# Patient Record
Sex: Male | Born: 1950 | Race: White | Hispanic: No | Marital: Married | Smoking: Former smoker
Health system: Southern US, Community
[De-identification: ages and names within clinical notes are randomized; demographics above are authoritative.]

## PROBLEM LIST (undated history)

## (undated) DIAGNOSIS — I10 Essential (primary) hypertension: Secondary | ICD-10-CM

## (undated) DIAGNOSIS — K219 Gastro-esophageal reflux disease without esophagitis: Secondary | ICD-10-CM

## (undated) DIAGNOSIS — E785 Hyperlipidemia, unspecified: Secondary | ICD-10-CM

## (undated) DIAGNOSIS — R7303 Prediabetes: Secondary | ICD-10-CM

## (undated) HISTORY — PX: FOOT SURGERY: SHX648

## (undated) HISTORY — PX: HARDWARE REMOVAL: SHX979

---

## 1985-06-19 HISTORY — PX: FOOT SURGERY: SHX648

## 2012-11-15 ENCOUNTER — Emergency Department (HOSPITAL_COMMUNITY)
Admission: EM | Admit: 2012-11-15 | Discharge: 2012-11-15 | Disposition: A | Discharge status: Home / Self Care | Payer: Self-pay | Source: Home / Self Care | Attending: Emergency Medicine | Admitting: Emergency Medicine

## 2012-11-15 ENCOUNTER — Encounter (HOSPITAL_COMMUNITY): Payer: Self-pay | Admitting: Emergency Medicine

## 2012-11-15 ENCOUNTER — Emergency Department (INDEPENDENT_AMBULATORY_CARE_PROVIDER_SITE_OTHER): Payer: Self-pay

## 2012-11-15 DIAGNOSIS — R509 Fever, unspecified: Secondary | ICD-10-CM

## 2012-11-15 LAB — POCT URINALYSIS DIP (DEVICE)
Bilirubin Urine: NEGATIVE
Glucose, UA: NEGATIVE mg/dL
Ketones, ur: NEGATIVE mg/dL
Specific Gravity, Urine: >=1.03 (ref 1.005–1.030)
Urobilinogen, UA: 1 mg/dL (ref 0.0–1.0)

## 2012-11-15 LAB — CBC WITH DIFFERENTIAL/PLATELET
Eosinophils Absolute: 0 10*3/uL (ref 0.0–0.7)
HCT: 44.5 % (ref 39.0–52.0)
Hemoglobin: 15.9 g/dL (ref 13.0–17.0)
Lymphs Abs: 0.8 10*3/uL (ref 0.7–4.0)
MCH: 30.7 pg (ref 26.0–34.0)
Monocytes Absolute: 0.7 10*3/uL (ref 0.1–1.0)
Monocytes Relative: 6 % (ref 3–12)
Neutro Abs: 9.6 10*3/uL — ABNORMAL HIGH (ref 1.7–7.7)
Neutrophils Relative %: 86 % — ABNORMAL HIGH (ref 43–77)
RBC: 5.18 MIL/uL (ref 4.22–5.81)

## 2012-11-15 MED ORDER — DOXYCYCLINE HYCLATE 100 MG PO TABS: 100.0000 mg | ORAL_TABLET | Freq: Two times a day (BID) | ORAL | Status: DC

## 2012-11-15 MED ORDER — CIPROFLOXACIN HCL 500 MG PO TABS: 500.0000 mg | ORAL_TABLET | Freq: Two times a day (BID) | ORAL | Status: DC

## 2012-11-15 NOTE — ED Provider Notes (Signed)
Chief Complaint:   Chief Complaint  Patient presents with  . Fever    fever x 2 days. asa for symptoms.     History of Present Illness:   Gillermo Poch is a 62 year old male with had a two-day history of fever of up to 13.8, malaise, and fatigue. He denies any other symptoms. Specifically, he denies headache, muscle aches, nasal congestion, rhinorrhea, sore throat, adenopathy, or stiff neck. He's had no coughing, wheezing, shortness of breath, or chest pain. He denies any abdominal pain, nausea, vomiting, or diarrhea. His appetite has been fair. He's eating a little bit less than usual but today had 2 cheeseburgers. He denies any urinary symptoms such as dysuria, frequency, urgency, or hematuria. He's had no aching or swelling in his arms or legs and denies any skin rash. He does have a history of a tick bite on his right arm about a week ago. He's had no recent foreign travel, no suspicious exposures or ingestions, he is exposed to multiple farm animals including pigs and chickens.  Review of Systems:  Other than noted above, the patient denies any of the following symptoms. Systemic:  No chills, sweats, fatigue, myalgias, headache, or anorexia. Eye:  No redness, pain or drainage. ENT:  No earache, nasal congestion, rhinorrhea, sinus pressure, or sore throat. No adenopathy or stiff neck. Lungs:  No cough, sputum production, wheezing, shortness of breath.  Cardiovascular:  No chest pain, palpitations, or syncope. GI:  No nausea, vomiting, abdominal pain or diarrhea. GU:  No dysuria, frequency, or hematuria. Skin:  No rash or pruritis.  PMFSH:  Past medical history, family history, social history, meds, and allergies were reviewed. There is no history of recent foreign travel, animal exposure, suspicious ingestions or tick bite.  No new medications, vaccination, or bites or stings.    Physical Exam:   Vital signs:  BP 147/82  Pulse 100  Temp(Src) 98.2 F (36.8 C) (Oral)  SpO2 97% General:   Alert, in no distress. Eye:  PERRL, full EOMs.  Lids and conjunctivas were normal. ENT:  TMs and canals were normal, without erythema or inflammation.  Nasal mucosa was clear and uncongested, without drainage.  Mucous membranes were moist.  Pharynx was clear, without exudate or drainage.  There were no oral ulcerations or lesions. Neck:  Supple, no adenopathy, tenderness or mass. Thyroid was normal. Lungs:  No respiratory distress.  Lungs were clear to auscultation, without wheezes, rales or rhonchi.  Breath sounds were clear and equal bilaterally. Heart:  Regular rhythm, without gallops, murmers or rubs. Abdomen:  Soft, flat, and non-tender to palpation.  No hepatosplenomagaly or mass. Extremities:  No swelling, erythema, or joint pain to palpation. Skin:  Clear, warm, and dry, without rash or lesions. He has a tiny red spot on his triceps area on his right arm where he had been bitten by a tick about a week ago. This measures no more than 1-2 mm.  Labs:   Results for orders placed during the hospital encounter of 11/15/12  CBC WITH DIFFERENTIAL      Result Value Range   WBC 11.2 (*) 4.0 - 10.5 K/uL   RBC 5.18  4.22 - 5.81 MIL/uL   Hemoglobin 15.9  13.0 - 17.0 g/dL   HCT 16.1  09.6 - 04.5 %   MCV 85.9  78.0 - 100.0 fL   MCH 30.7  26.0 - 34.0 pg   MCHC 35.7  30.0 - 36.0 g/dL   RDW 40.9  81.1 - 91.4 %  Platelets 135 (*) 150 - 400 K/uL   Neutrophils Relative % 86 (*) 43 - 77 %   Neutro Abs 9.6 (*) 1.7 - 7.7 K/uL   Lymphocytes Relative 8 (*) 12 - 46 %   Lymphs Abs 0.8  0.7 - 4.0 K/uL   Monocytes Relative 6  3 - 12 %   Monocytes Absolute 0.7  0.1 - 1.0 K/uL   Eosinophils Relative 0  0 - 5 %   Eosinophils Absolute 0.0  0.0 - 0.7 K/uL   Basophils Relative 0  0 - 1 %   Basophils Absolute 0.0  0.0 - 0.1 K/uL  POCT URINALYSIS DIP (DEVICE)      Result Value Range   Glucose, UA NEGATIVE  NEGATIVE mg/dL   Bilirubin Urine NEGATIVE  NEGATIVE   Ketones, ur NEGATIVE  NEGATIVE mg/dL   Specific  Gravity, Urine >=1.030  1.005 - 1.030   Hgb urine dipstick MODERATE (*) NEGATIVE   pH 5.5  5.0 - 8.0   Protein, ur 100 (*) NEGATIVE mg/dL   Urobilinogen, UA 1.0  0.0 - 1.0 mg/dL   Nitrite NEGATIVE  NEGATIVE   Leukocytes, UA NEGATIVE  NEGATIVE    Urine culture was obtained as well as serologies for [facility] spotted fever.  Radiology:  Dg Chest 2 View  11/15/2012   *RADIOLOGY REPORT*  Clinical Data: Fever.  CHEST - 2 VIEW  Comparison: None.  Findings: Mild bibasilar scarring noted.  No evidence of pulmonary infiltrate or edema.  Evidence of pleural effusion.  Heart size and mediastinal contours are normal.  IMPRESSION: Mild bibasilar scarring.  No active disease.   Original Report Authenticated By: Myles Rosenthal, M.D.   Assessment:  The encounter diagnosis was FUO (fever of unknown origin).  Right now diagnostic considerations include urinary tract infection or [facility] spotted fever. We'll treat with both Cipro and doxycycline. I suggested he come back again in 48-72 hours for recheck. We'll his lab work should be back by then. We'll need to recheck the urine at that time. I did tell him that he will need to followup with her primary care physician and get at least 2 clear urine this, before we can say that there is no concern about the hematuria. He does have any persistent hematuria, he will need to see a urologist.  Plan:   1.  The following meds were prescribed:   New Prescriptions   CIPROFLOXACIN (CIPRO) 500 MG TABLET    Take 1 tablet (500 mg total) by mouth every 12 (twelve) hours.   DOXYCYCLINE (VIBRA-TABS) 100 MG TABLET    Take 1 tablet (100 mg total) by mouth 2 (two) times daily.   2.  The patient was instructed in symptomatic care and handouts were given. 3.  The patient was told to return if becoming worse in any way, for a scheduled recheck in 48-72 hours, and given some red flag symptoms such as difficulty breathing, pain, or persistent vomiting, or shortness of breath that  would indicate earlier return. 4.  Follow up here in 48-72 hours.    Reuben Likes, MD 11/15/12 2045

## 2012-11-15 NOTE — ED Notes (Signed)
Pt c/o fever and fatigue x 2 days. Denies any other symptoms.   Pt's brother states that over the past several weeks he has had tick bites.

## 2012-11-17 ENCOUNTER — Encounter (HOSPITAL_COMMUNITY): Payer: Self-pay | Admitting: Emergency Medicine

## 2012-11-17 ENCOUNTER — Emergency Department (HOSPITAL_COMMUNITY)
Admission: EM | Admit: 2012-11-17 | Discharge: 2012-11-17 | Disposition: A | Discharge status: Home / Self Care | Payer: Self-pay | Source: Home / Self Care | Attending: Family Medicine | Admitting: Family Medicine

## 2012-11-17 DIAGNOSIS — R509 Fever, unspecified: Secondary | ICD-10-CM

## 2012-11-17 HISTORY — DX: Essential (primary) hypertension: I10

## 2012-11-17 LAB — URINE CULTURE
Colony Count: NO GROWTH
Culture: NO GROWTH

## 2012-11-17 NOTE — ED Notes (Signed)
Pt here for follow up to visit on 5/30. Was seen for a high fever over several days. Pt is still taking prescribed meds.

## 2012-11-17 NOTE — ED Provider Notes (Signed)
History     CSN: [id number]  Arrival date & time 11/17/12  1543   None     Chief Complaint  Patient presents with  . Follow-up    follow up to visit on 5/30.     (Consider location/radiation/quality/duration/timing/severity/associated sxs/prior treatment) Patient is a 62 y.o. male presenting with general illness. The history is provided by the patient.  Illness Progression:  Improving Chronicity:  New Context:  Seen 5/30 , taking abx, overall improved, unable to provide u/a today, Associated symptoms: no abdominal pain, no chest pain, no cough and no fever     Past Medical History  Diagnosis Date  . Hypertension     Past Surgical History  Procedure Laterality Date  . Foot surgery      History reviewed. No pertinent family history.  History  Substance Use Topics  . Smoking status: Never Smoker   . Smokeless tobacco: Not on file  . Alcohol Use: Yes      Review of Systems  Constitutional: Negative.  Negative for fever.  Respiratory: Negative for cough.   Cardiovascular: Negative for chest pain.  Gastrointestinal: Negative.  Negative for abdominal pain.  Genitourinary: Negative.     Allergies  Review of patient's allergies indicates no known allergies.  Home Medications   Current Outpatient Rx  Name  Route  Sig  Dispense  Refill  . ciprofloxacin (CIPRO) 500 MG tablet   Oral   Take 1 tablet (500 mg total) by mouth every 12 (twelve) hours.   20 tablet   0   . doxycycline (VIBRA-TABS) 100 MG tablet   Oral   Take 1 tablet (100 mg total) by mouth 2 (two) times daily.   20 tablet   0     There were no vitals taken for this visit.  Physical Exam  Nursing note and vitals reviewed. Constitutional: He is oriented to person, place, and time. He appears well-developed and well-nourished. No distress.  HENT:  Mouth/Throat: Oropharynx is clear and moist.  Eyes: Conjunctivae are normal. Pupils are equal, round, and reactive to light.  Neck: Normal range  of motion. Neck supple.  Cardiovascular: Normal heart sounds.   Pulmonary/Chest: Breath sounds normal.  Abdominal: Soft. Bowel sounds are normal. He exhibits no distension and no mass. There is no tenderness. There is no rebound and no guarding.  Neurological: He is alert and oriented to person, place, and time.  Skin: Skin is warm and dry.    ED Course  Procedures (including critical care time)  Labs Reviewed - No data to display Dg Chest 2 View  11/15/2012   *RADIOLOGY REPORT*  Clinical Data: Fever.  CHEST - 2 VIEW  Comparison: None.  Findings: Mild bibasilar scarring noted.  No evidence of pulmonary infiltrate or edema.  Evidence of pleural effusion.  Heart size and mediastinal contours are normal.  IMPRESSION: Mild bibasilar scarring.  No active disease.   Original Report Authenticated By: Myles Rosenthal, M.D.     1. Fever of undetermined origin       MDM  Urine cx no growth, rmsf screen incomplete. Pt unable to void, will d/c and f/u in 4 days for recheck.        Linna Hoff, MD 11/17/12 [phone]

## 2012-11-17 NOTE — ED Notes (Signed)
Pt states not feeling to bad. Mw,cma

## 2012-11-18 NOTE — ED Notes (Signed)
Chart review; notification from lab for elevated RMSF , reading 1.46; Dr Lorenz Coaster advised; pt already on antibiotics

## 2012-11-19 NOTE — ED Notes (Signed)
Attempted to call to notify of lab results, voice mail not yet set up by patient , and unable to leave message

## 2012-11-19 NOTE — ED Notes (Signed)
Spoke w patient , states he is feeling better, taking Rx as directed , and plans to return for a recheck in AM

## 2012-11-19 NOTE — ED Notes (Signed)
Attempted to leave voice mail message, but recording indicates the [facility] has not yet been set up ; will attempt later

## 2015-01-06 IMAGING — CR DG CHEST 2V
2 series · 2 of 2 positions shown · non-contrast
Comparison: None.

CLINICAL DATA: Fever.

CHEST - 2 VIEW

[view not recorded (1 of 2)]
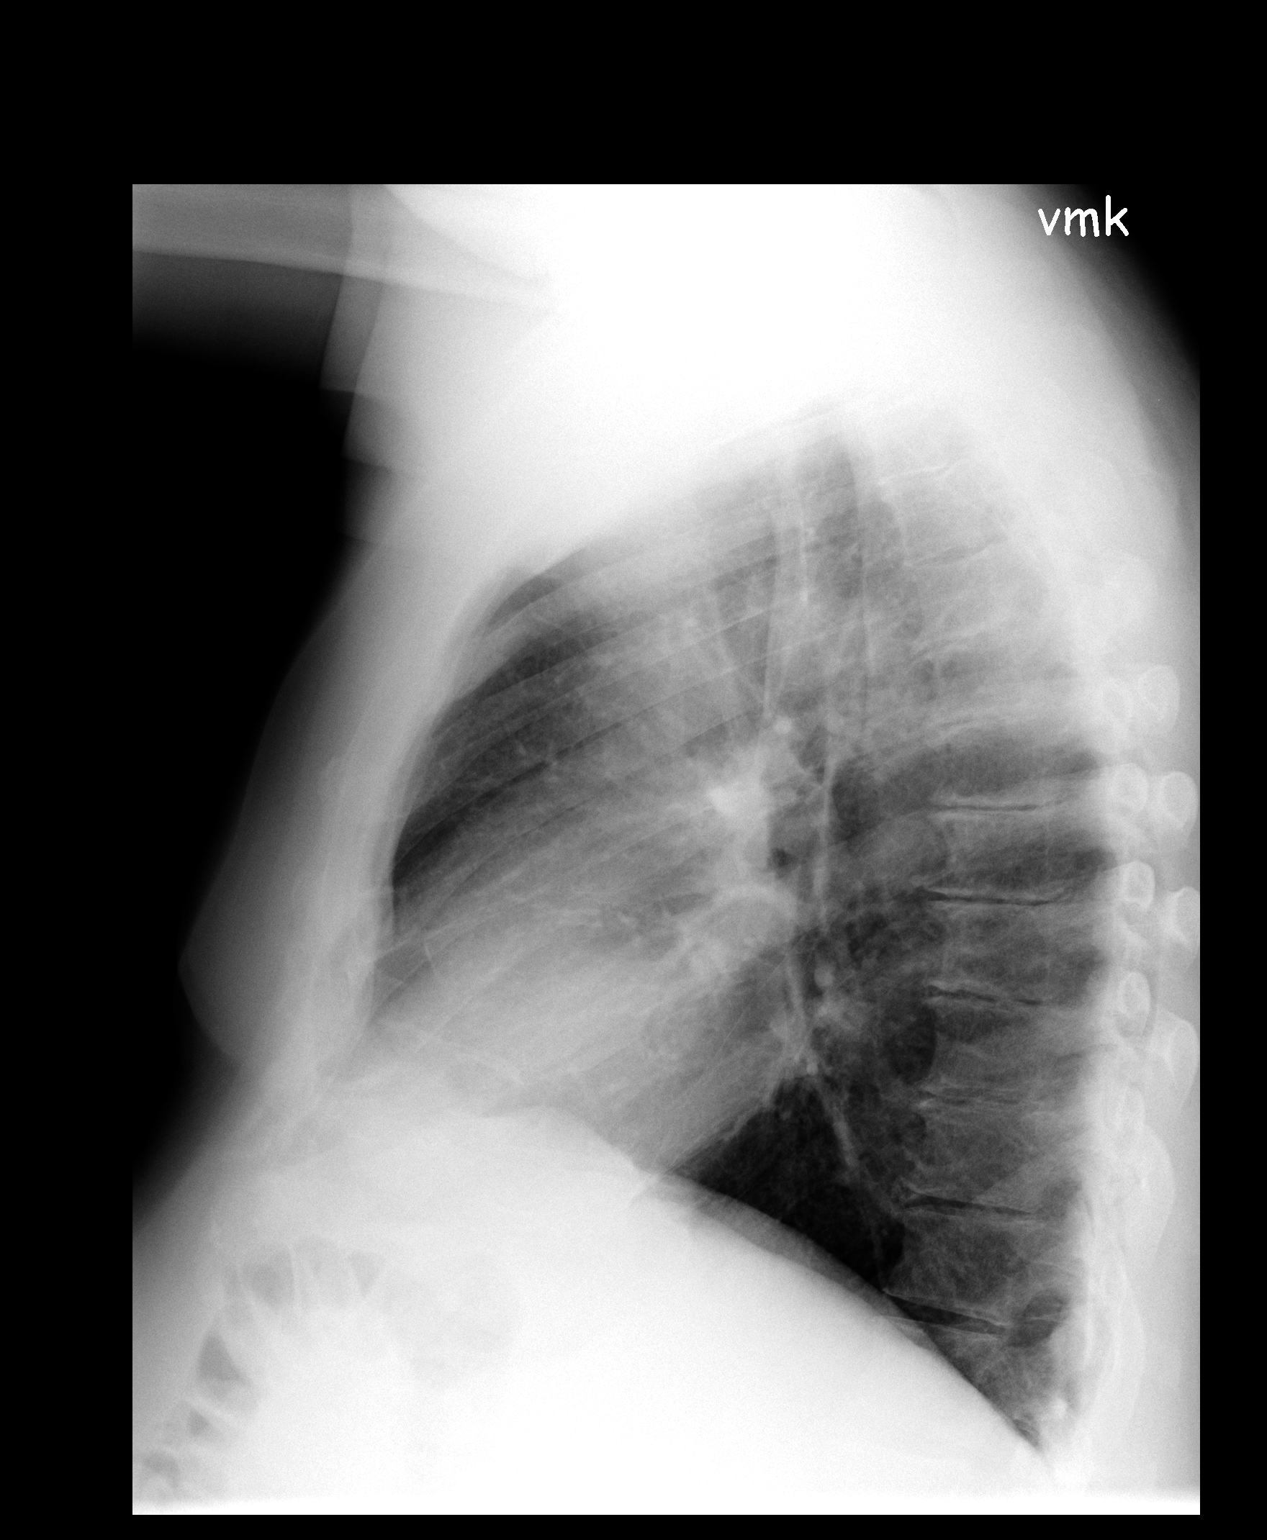

[view not recorded (2 of 2)]
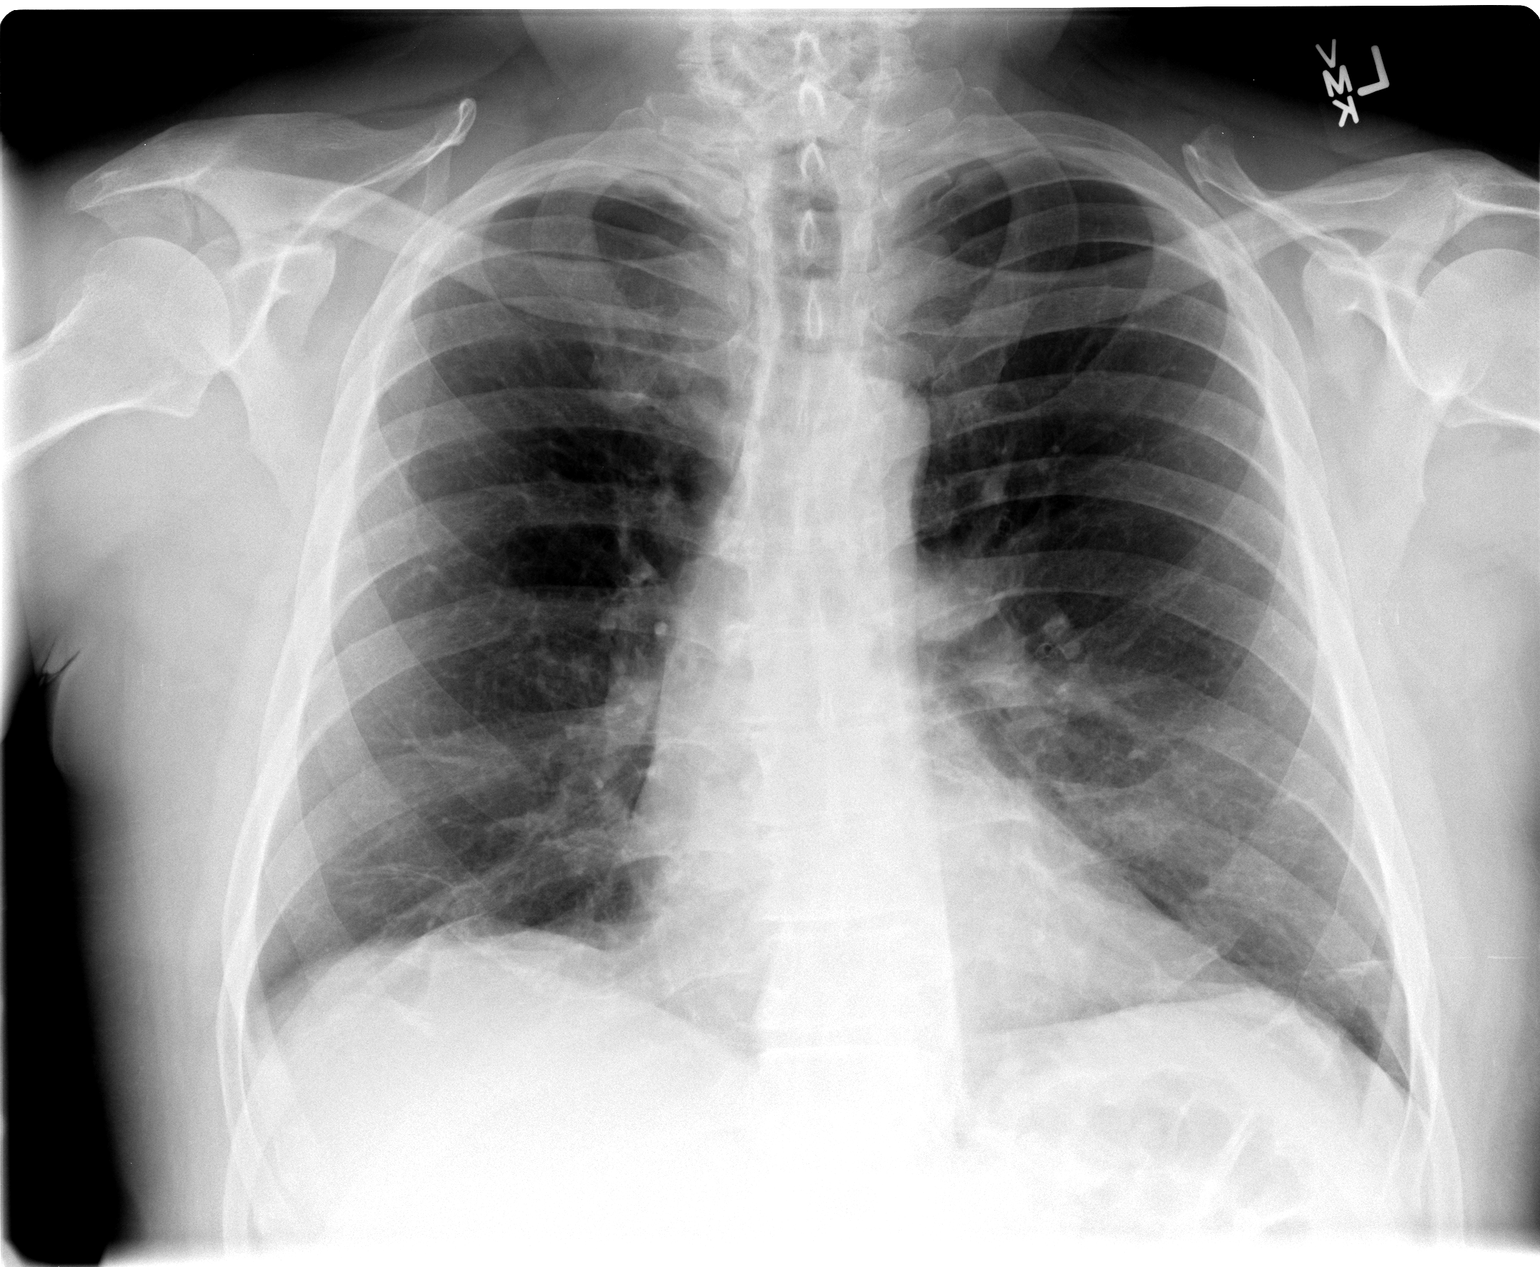

[2 of 2 positions shown; findings below may reference images not displayed]

FINDINGS: Mild bibasilar scarring noted.  No evidence of pulmonary
infiltrate or edema.  Evidence of pleural effusion.  Heart size and
mediastinal contours are normal.
IMPRESSION: Mild bibasilar scarring.  No active disease.

## 2017-04-23 ENCOUNTER — Emergency Department (HOSPITAL_COMMUNITY)
Admission: EM | Admit: 2017-04-23 | Discharge: 2017-04-24 | Disposition: A | Discharge status: Home / Self Care | Payer: PPO | Attending: Emergency Medicine | Admitting: Emergency Medicine

## 2017-04-23 ENCOUNTER — Encounter (HOSPITAL_COMMUNITY): Payer: Self-pay | Admitting: Emergency Medicine

## 2017-04-23 ENCOUNTER — Emergency Department (HOSPITAL_COMMUNITY): Payer: PPO

## 2017-04-23 DIAGNOSIS — S4992XA Unspecified injury of left shoulder and upper arm, initial encounter: Secondary | ICD-10-CM | POA: Diagnosis not present

## 2017-04-23 DIAGNOSIS — I10 Essential (primary) hypertension: Secondary | ICD-10-CM | POA: Insufficient documentation

## 2017-04-23 DIAGNOSIS — Y9301 Activity, walking, marching and hiking: Secondary | ICD-10-CM | POA: Diagnosis not present

## 2017-04-23 DIAGNOSIS — M25512 Pain in left shoulder: Secondary | ICD-10-CM | POA: Diagnosis not present

## 2017-04-23 DIAGNOSIS — Z79899 Other long term (current) drug therapy: Secondary | ICD-10-CM | POA: Insufficient documentation

## 2017-04-23 DIAGNOSIS — Y92009 Unspecified place in unspecified non-institutional (private) residence as the place of occurrence of the external cause: Secondary | ICD-10-CM | POA: Insufficient documentation

## 2017-04-23 DIAGNOSIS — Y999 Unspecified external cause status: Secondary | ICD-10-CM | POA: Diagnosis not present

## 2017-04-23 DIAGNOSIS — W0110XA Fall on same level from slipping, tripping and stumbling with subsequent striking against unspecified object, initial encounter: Secondary | ICD-10-CM | POA: Insufficient documentation

## 2017-04-23 NOTE — ED Notes (Signed)
Possible shoulder dislocation. CN notified.

## 2017-04-23 NOTE — ED Triage Notes (Signed)
Pt fell today on his left side and he has possible a left shoulder dislocation, pt took some Ibuprofen pta to ED some deformity noticed on triage.

## 2017-04-24 NOTE — Discharge Instructions (Signed)
You may alternate Tylenol 1000 mg every 6 hours as needed for pain and Ibuprofen 800 mg every 8 hours as needed for pain.  Please take Ibuprofen with food. ° °

## 2017-04-24 NOTE — ED Provider Notes (Signed)
TIME SEEN: 4:21 AM  CHIEF COMPLAINT: Left shoulder pain  HPI: Patient is a 66 year old male with history of hypertension who is right-hand-dominant who presents to the emergency department with left shoulder pain.  Reports that he slipped on his deck and caught his left upper arm on the railing.  Reports that he has had pain since.  Did not hit his head or lose consciousness.  Denies neck or back pain.  No chest or abdominal pain.  No numbness, tingling or focal weakness.  Does have pain with any movement of the left shoulder.  States that it looked deformed to him when he was concerned it could have been dislocated.  ROS: See HPI Constitutional: no fever  Eyes: no drainage  ENT: no runny nose   Cardiovascular:  no chest pain  Resp: no SOB  GI: no vomiting GU: no dysuria Integumentary: no rash  Allergy: no hives  Musculoskeletal: no leg swelling  Neurological: no slurred speech ROS otherwise negative  PAST MEDICAL HISTORY/PAST SURGICAL HISTORY:  Past Medical History:  Diagnosis Date  . Hypertension     MEDICATIONS:  Prior to Admission medications   Medication Sig Start Date End Date Taking? Authorizing Provider  ciprofloxacin (CIPRO) 500 MG tablet Take 1 tablet (500 mg total) by mouth every 12 (twelve) hours. 11/15/12   Harden Mo, MD  doxycycline (VIBRA-TABS) 100 MG tablet Take 1 tablet (100 mg total) by mouth 2 (two) times daily. 11/15/12   Harden Mo, MD    ALLERGIES:  No Known Allergies  SOCIAL HISTORY:  Social History   Tobacco Use  . Smoking status: Never Smoker  Substance Use Topics  . Alcohol use: Yes    FAMILY HISTORY: No family history on file.  EXAM: BP (!) 145/95   Pulse 67   Temp 98.3 F (36.8 C) (Oral)   Resp 16   Ht 5\' 6"  (1.676 m)   Wt 97.5 kg (215 lb)   SpO2 97%   BMI 34.70 kg/m  CONSTITUTIONAL: Alert and oriented and responds appropriately to questions. Well-appearing; well-nourished; GCS 15 HEAD: Normocephalic; atraumatic EYES:  Conjunctivae clear, PERRL, EOMI ENT: normal nose; no rhinorrhea; moist mucous membranes; pharynx without lesions noted; no dental injury; no septal hematoma NECK: Supple, no meningismus, no LAD; no midline spinal tenderness, step-off or deformity; trachea midline CARD: RRR; S1 and S2 appreciated; no murmurs, no clicks, no rubs, no gallops RESP: Normal chest excursion without splinting or tachypnea; breath sounds clear and equal bilaterally; no wheezes, no rhonchi, no rales; no hypoxia or respiratory distress CHEST:  chest wall stable, no crepitus or ecchymosis or deformity, nontender to palpation; no flail chest ABD/GI: Normal bowel sounds; non-distended; soft, non-tender, no rebound, no guarding; no ecchymosis or other lesions noted PELVIS:  stable, nontender to palpation BACK:  The back appears normal and is non-tender to palpation, there is no CVA tenderness; no midline spinal tenderness, step-off or deformity EXT: Tender to palpation diffusely over the left shoulder without any deformity.  No loss of fullness of the left shoulder joint.  No other bony tenderness of the left arm.  Normal grip strength bilaterally.  2+ radial pulses bilaterally.  Normal sensation throughout the entire left arm.  Decreased range of motion the left shoulder secondary to pain but he is able to reach across his chest and touch the right shoulder.  Otherwise normal ROM in all joints; otherwise extremities are non-tender to palpation; no edema; normal capillary refill; no cyanosis, no joint effusion, compartments are  soft, extremities are warm and well-perfused, no ecchymosis SKIN: Normal color for age and race; warm NEURO: Moves all extremities equally PSYCH: The patient's mood and manner are appropriate. Grooming and personal hygiene are appropriate.  MEDICAL DECISION MAKING: Patient here with left shoulder pain after mechanical fall.  X-ray shows no acute abnormality.  No dislocation seen on x-ray or appreciated on  exam.  Clavicle appears normal.  No other injury.  Neurovascularly intact distally.  I have offered him pain control with narcotics but he declines and I feel it is reasonable to have him alternate Tylenol and Motrin and apply ice.  I do not recommend a sling at this time given it can cause adhesive capsulitis and he agrees.  We will give him orthopedic follow-up.  He states he does have a PCP for follow-up as well as medical management does not work.  We discussed the possibility of ligamentous injury, rotator cuff tear and the possible need for MRI if symptoms are not improving with medical management.  Patient and friend at bedside comfortable with this plan.  Discussed return precautions.    At this time, I do not feel there is any life-threatening condition present. I have reviewed and discussed all results (EKG, imaging, lab, urine as appropriate) and exam findings with patient/family. I have reviewed nursing notes and appropriate previous records.  I feel the patient is safe to be discharged home without further emergent workup and can continue workup as an outpatient as needed. Discussed usual and customary return precautions. Patient/family verbalize understanding and are comfortable with this plan.  Outpatient follow-up has been provided if needed. All questions have been answered.      Ibrahim Mcpheeters, Delice Bison, DO 04/24/17 [phone]

## 2018-07-24 NOTE — Progress Notes (Signed)
Subjective:    Patient ID: John Franco, male    DOB: 27-Dec-1950, 68 y.o.   MRN: [id number]  HPI:  John Franco is here to establish as a new pt.  He is a pleasant 68 year old male. 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 Healthcare maintenance 07/31/2018  . Colon cancer screening 07/31/2018  . Encounter for abdominal aortic aneurysm (AAA) screening 07/31/2018     Past Medical History:  Diagnosis Date  . Hypertension      Past Surgical History:  Procedure Laterality Date  . FOOT SURGERY       Family History  Problem Relation Age of Onset  . Arthritis Mother   . Cancer Mother        breast  . Heart attack Father   . Cancer Brother        colon  . Cancer Maternal Grandmother        breast  . Pulmonary embolism Maternal Grandfather   . Cancer Paternal Grandmother        ovarian  . Cancer Paternal Grandfather        prostate     Social History   Substance and Sexual Activity  Drug Use No     Social History   Substance and Sexual Activity  Alcohol Use Yes   Comment: very seldom     Social History   Tobacco Use  Smoking Status Former Smoker  . Years: 10.00  . Types: Pipe  . Last  attempt to quit: 06/19/1978  . Years since quitting: 40.1  Smokeless Tobacco Never Used     Outpatient Encounter Medications as of 07/31/2018  Medication Sig  . pneumococcal 13-valent conjugate vaccine (PREVNAR 13) SUSP injection Inject 0.5 mLs into the muscle tomorrow at 10 am for 1 dose.  Marland Kitchen Zoster Vaccine Adjuvanted [facility]) injection Inject 0.5 mLs into the muscle once for 1 dose.  . [DISCONTINUED] ciprofloxacin (CIPRO) 500 MG tablet Take 1 tablet (500 mg total) by mouth every 12 (twelve) hours.  . [DISCONTINUED] doxycycline (VIBRA-TABS) 100 MG tablet Take 1 tablet (100 mg total) by mouth 2 (two) times daily.   No facility-administered encounter medications on file as of 07/31/2018.     Allergies: Codeine  Body mass index is 34.97 kg/m.  Blood pressure (!) 147/74, pulse 74, temperature 98.6 F (37 C), temperature source Oral, height 5\' 4"  (1.626 m), weight 203 lb 11.2 oz (92.4 kg), SpO2 96 %.  Review of Systems  Constitutional: Positive for fatigue. Negative for activity change, appetite change, chills, diaphoresis, fever and unexpected weight change.  HENT: Positive for congestion, postnasal drip, rhinorrhea and sinus pressure.   Respiratory: Negative for cough, chest tightness, shortness of breath,  wheezing and stridor.   Cardiovascular: Negative for chest pain, palpitations and leg swelling.  Gastrointestinal:       L inguinal hernia ?  Genitourinary: Negative for difficulty urinating and frequency.  Musculoskeletal: Positive for arthralgias.  Skin: Negative for color change, pallor, rash and wound.  Neurological: Negative for dizziness and headaches.  Hematological: Does not bruise/bleed easily.  Psychiatric/Behavioral: Negative for agitation, behavioral problems, confusion, decreased concentration, dysphoric mood, hallucinations, self-injury, sleep disturbance and suicidal ideas. The patient is not nervous/anxious and is not hyperactive.        Objective:   Physical  Exam Vitals signs and nursing note reviewed.  Constitutional:      General: He is not in acute distress.    Appearance: He is obese. He is not ill-appearing, toxic-appearing or diaphoretic.  HENT:     Head: Normocephalic and atraumatic.  Cardiovascular:     Rate and Rhythm: Normal rate.     Pulses: Normal pulses.     Heart sounds: Normal heart sounds. No murmur. No friction rub. No gallop.   Pulmonary:     Effort: Pulmonary effort is normal. No respiratory distress.     Breath sounds: Normal breath sounds. No stridor. No wheezing, rhonchi or rales.  Chest:     Chest wall: No tenderness.  Skin:    General: Skin is warm and dry.     Capillary Refill: Capillary refill takes less than 2 seconds.  Neurological:     Mental Status: He is alert.  Psychiatric:        Mood and Affect: Mood normal.        Behavior: Behavior normal.        Thought Content: Thought content normal.        Judgment: Judgment normal.       Assessment & Plan:   1. Need for zoster vaccination   2. Need for pneumococcal vaccination   3. Healthcare maintenance   4. Need for influenza vaccination   5. Encounter for abdominal aortic aneurysm (AAA) screening   6. Colon cancer screening     Healthcare maintenance  Increase water intake, strive for at least 68 ounces/day.   Follow DASH diet Both blood pressure's above goal today (goal <130/80) If blood pressure above goal at next appt, will start on Lisinopril (ACE class of anti-hypertensive). Increase regular exercise.  Recommend at least 30 minutes daily, 5 days per week of walking, jogging, biking, swimming, YouTube/Pinterest workout videos. Please schedule complete physical in 3 weeks, fasting lab appt the week prior.  Encounter for abdominal aortic aneurysm (AAA) screening [facility] [place] ordered  Colon cancer screening Brother dx'd at age 63 with colon ca, passed away one year later He has only had one colonoscopy C-scope order placed     FOLLOW-UP:   Return in about 3 weeks (around 08/21/2018) for CPE, Fasting Labs.

## 2018-07-31 ENCOUNTER — Ambulatory Visit (INDEPENDENT_AMBULATORY_CARE_PROVIDER_SITE_OTHER): Payer: PPO | Admitting: Adult Health

## 2018-07-31 ENCOUNTER — Encounter: Payer: Self-pay | Admitting: Adult Health

## 2018-07-31 VITALS — BP 147/74 | HR 74 | Temp 98.6°F | Ht 64.0 in | Wt 203.7 lb

## 2018-07-31 DIAGNOSIS — Z136 Encounter for screening for cardiovascular disorders: Secondary | ICD-10-CM | POA: Diagnosis not present

## 2018-07-31 DIAGNOSIS — Z1211 Encounter for screening for malignant neoplasm of colon: Secondary | ICD-10-CM | POA: Diagnosis not present

## 2018-07-31 DIAGNOSIS — Z0000 Encounter for general adult medical examination without abnormal findings: Secondary | ICD-10-CM | POA: Insufficient documentation

## 2018-07-31 DIAGNOSIS — Z23 Encounter for immunization: Secondary | ICD-10-CM

## 2018-07-31 MED ORDER — PNEUMOCOCCAL 13-VAL CONJ VACC IM SUSP: 0.5000 mL | INTRAMUSCULAR | 0 refills | Status: AC

## 2018-07-31 MED ORDER — ZOSTER VAC RECOMB ADJUVANTED 50 MCG/0.5ML IM SUSR: 0.5000 mL | Freq: Once | INTRAMUSCULAR | 0 refills | Status: AC

## 2018-07-31 NOTE — Assessment & Plan Note (Signed)
  Increase water intake, strive for at least 68 ounces/day.   Follow DASH diet Both blood pressure's above goal today (goal <130/80) If blood pressure above goal at next appt, will start on Lisinopril (ACE class of anti-hypertensive). Increase regular exercise.  Recommend at least 30 minutes daily, 5 days per week of walking, jogging, biking, swimming, YouTube/Pinterest workout videos. Please schedule complete physical in 3 weeks, fasting lab appt the week prior.

## 2018-07-31 NOTE — Patient Instructions (Addendum)
DASH Eating Plan DASH stands for "Dietary Approaches to Stop Hypertension." The DASH eating plan is a healthy eating plan that has been shown to reduce high blood pressure (hypertension). It may also reduce your risk for type 2 diabetes, heart disease, and stroke. The DASH eating plan may also help with weight loss. What are tips for following this plan?  General guidelines  Avoid eating more than 2,300 mg (milligrams) of salt (sodium) a day. If you have hypertension, you may need to reduce your sodium intake to 1,500 mg a day.  Limit alcohol intake to no more than 1 drink a day for nonpregnant women and 2 drinks a day for men. One drink equals 12 oz of beer, 5 oz of wine, or 1 oz of hard liquor.  Work with your health care provider to maintain a healthy body weight or to lose weight. Ask what an ideal weight is for you.  Get at least 30 minutes of exercise that causes your heart to beat faster (aerobic exercise) most days of the week. Activities may include walking, swimming, or biking.  Work with your health care provider or diet and nutrition specialist (dietitian) to adjust your eating plan to your individual calorie needs. Reading food labels   Check food labels for the amount of sodium per serving. Choose foods with less than 5 percent of the Daily Value of sodium. Generally, foods with less than 300 mg of sodium per serving fit into this eating plan.  To find whole grains, look for the word "whole" as the first word in the ingredient list. Shopping  Buy products labeled as "low-sodium" or "no salt added."  Buy fresh foods. Avoid canned foods and premade or frozen meals. Cooking  Avoid adding salt when cooking. Use salt-free seasonings or herbs instead of table salt or sea salt. Check with your health care provider or pharmacist before using salt substitutes.  Do not fry foods. Cook foods using healthy methods such as baking, boiling, grilling, and broiling instead.  Cook with  heart-healthy oils, such as olive, canola, soybean, or sunflower oil. Meal planning  Eat a balanced diet that includes: ? 5 or more servings of fruits and vegetables each day. At each meal, try to fill half of your plate with fruits and vegetables. ? Up to 6-8 servings of whole grains each day. ? Less than 6 oz of lean meat, poultry, or fish each day. A 3-oz serving of meat is about the same size as a deck of cards. One egg equals 1 oz. ? 2 servings of low-fat dairy each day. ? A serving of nuts, seeds, or beans 5 times each week. ? Heart-healthy fats. Healthy fats called Omega-3 fatty acids are found in foods such as flaxseeds and coldwater fish, like sardines, salmon, and mackerel.  Limit how much you eat of the following: ? Canned or prepackaged foods. ? Food that is high in trans fat, such as fried foods. ? Food that is high in saturated fat, such as fatty meat. ? Sweets, desserts, sugary drinks, and other foods with added sugar. ? Full-fat dairy products.  Do not salt foods before eating.  Try to eat at least 2 vegetarian meals each week.  Eat more home-cooked food and less restaurant, buffet, and fast food.  When eating at a restaurant, ask that your food be prepared with less salt or no salt, if possible. What foods are recommended? The items listed may not be a complete list. Talk with your dietitian about   what dietary choices are best for you. Grains Whole-grain or whole-wheat bread. Whole-grain or whole-wheat pasta. Brown rice. Oatmeal. Quinoa. Bulgur. Whole-grain and low-sodium cereals. Pita bread. Low-fat, low-sodium crackers. Whole-wheat flour tortillas. Vegetables Fresh or frozen vegetables (raw, steamed, roasted, or grilled). Low-sodium or reduced-sodium tomato and vegetable juice. Low-sodium or reduced-sodium tomato sauce and tomato paste. Low-sodium or reduced-sodium canned vegetables. Fruits All fresh, dried, or frozen fruit. Canned fruit in natural juice (without  added sugar). Meat and other protein foods Skinless chicken or turkey. Ground chicken or turkey. Pork with fat trimmed off. Fish and seafood. Egg whites. Dried beans, peas, or lentils. Unsalted nuts, nut butters, and seeds. Unsalted canned beans. Lean cuts of beef with fat trimmed off. Low-sodium, lean deli meat. Dairy Low-fat (1%) or fat-free (skim) milk. Fat-free, low-fat, or reduced-fat cheeses. Nonfat, low-sodium ricotta or cottage cheese. Low-fat or nonfat yogurt. Low-fat, low-sodium cheese. Fats and oils Soft margarine without trans fats. Vegetable oil. Low-fat, reduced-fat, or light mayonnaise and salad dressings (reduced-sodium). Canola, safflower, olive, soybean, and sunflower oils. Avocado. Seasoning and other foods Herbs. Spices. Seasoning mixes without salt. Unsalted popcorn and pretzels. Fat-free sweets. What foods are not recommended? The items listed may not be a complete list. Talk with your dietitian about what dietary choices are best for you. Grains Baked goods made with fat, such as croissants, muffins, or some breads. Dry pasta or rice meal packs. Vegetables Creamed or fried vegetables. Vegetables in a cheese sauce. Regular canned vegetables (not low-sodium or reduced-sodium). Regular canned tomato sauce and paste (not low-sodium or reduced-sodium). Regular tomato and vegetable juice (not low-sodium or reduced-sodium). Pickles. Olives. Fruits Canned fruit in a light or heavy syrup. Fried fruit. Fruit in cream or butter sauce. Meat and other protein foods Fatty cuts of meat. Ribs. Fried meat. Bacon. Sausage. Bologna and other processed lunch meats. Salami. Fatback. Hotdogs. Bratwurst. Salted nuts and seeds. Canned beans with added salt. Canned or smoked fish. Whole eggs or egg yolks. Chicken or turkey with skin. Dairy Whole or 2% milk, cream, and half-and-half. Whole or full-fat cream cheese. Whole-fat or sweetened yogurt. Full-fat cheese. Nondairy creamers. Whipped toppings.  Processed cheese and cheese spreads. Fats and oils Butter. Stick margarine. Lard. Shortening. Ghee. Bacon fat. Tropical oils, such as coconut, palm kernel, or palm oil. Seasoning and other foods Salted popcorn and pretzels. Onion salt, garlic salt, seasoned salt, table salt, and sea salt. Worcestershire sauce. Tartar sauce. Barbecue sauce. Teriyaki sauce. Soy sauce, including reduced-sodium. Steak sauce. Canned and packaged gravies. Fish sauce. Oyster sauce. Cocktail sauce. Horseradish that you find on the shelf. Ketchup. Mustard. Meat flavorings and tenderizers. Bouillon cubes. Hot sauce and Tabasco sauce. Premade or packaged marinades. Premade or packaged taco seasonings. Relishes. Regular salad dressings. Where to find more information:  National Heart, Lung, and Blood Institute: www.nhlbi.nih.gov  American Heart Association: www.heart.org Summary  The DASH eating plan is a healthy eating plan that has been shown to reduce high blood pressure (hypertension). It may also reduce your risk for type 2 diabetes, heart disease, and stroke.  With the DASH eating plan, you should limit salt (sodium) intake to 2,300 mg a day. If you have hypertension, you may need to reduce your sodium intake to 1,500 mg a day.  When on the DASH eating plan, aim to eat more fresh fruits and vegetables, whole grains, lean proteins, low-fat dairy, and heart-healthy fats.  Work with your health care provider or diet and nutrition specialist (dietitian) to adjust your eating plan to your   individual calorie needs. This information is not intended to replace advice given to you by your health care provider. Make sure you discuss any questions you have with your health care provider. Document Released: 05/25/2011 Document Revised: 05/29/2016 Document Reviewed: 05/29/2016 Elsevier Interactive Patient Education  2019 [employer].   Increase water intake, strive for at least 68 ounces/day.   Follow DASH diet Both blood  pressure's above goal today (goal <130/80) If blood pressure above goal at next appt, will start on Lisinopril (ACE class of anti-hypertensive). Increase regular exercise.  Recommend at least 30 minutes daily, 5 days per week of walking, jogging, biking, swimming, YouTube/Pinterest workout videos. Please schedule complete physical in 3 weeks, fasting lab appt the week prior. WELCOME TO THE PRACTICE!

## 2018-07-31 NOTE — Assessment & Plan Note (Signed)
AAA US ordered.

## 2018-07-31 NOTE — Assessment & Plan Note (Signed)
Brother dx'd at age 68 with colon ca, passed away one year later He has only had one colonoscopy C-scope order placed

## 2018-08-08 ENCOUNTER — Ambulatory Visit
Admission: RE | Admit: 2018-08-08 | Discharge: 2018-08-08 | Disposition: A | Discharge status: Home / Self Care | Payer: PPO | Source: Ambulatory Visit | Attending: Adult Health | Admitting: Adult Health

## 2018-08-08 DIAGNOSIS — Z87891 Personal history of nicotine dependence: Secondary | ICD-10-CM | POA: Diagnosis not present

## 2018-08-08 DIAGNOSIS — Z136 Encounter for screening for cardiovascular disorders: Secondary | ICD-10-CM | POA: Diagnosis not present

## 2018-08-27 NOTE — Progress Notes (Signed)
Subjective:    Patient ID: John Franco, male    DOB: 1950-10-07, 68 y.o.   MRN: [id number]  HPI:07/31/2018 OV:   John Franco is here to establish as a new pt.  He is a pleasant 68 year old male. 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  He denies tobacco/vape/EOTH use He has not had regular healthcare in years Brother dx'd at age 59 with colon ca, passed away one year later He has only had one colonoscopy C-scope order placed   09/02/2018 OV: John Franco is here for CPE One issue- worsening L inguinal hernia- gradually increasing in size the last 12 months He denies pain at site He is 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 wheelchair 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 and is suffering care giver strain, we discussed Respite Care options- he is interested in local resources He denies acute depression/anxiety He denies tobacco/vape/ETOH use He denies regular exercise, but remains active with constant house/yard/"life" work  Reviewed Recent Labs TSH-WNL, 1.10 CMP-WNL CBC-WNL A1c-5.9, pre-diabetic The 10-year ASCVD risk score [name], et al., 2013) is: 22.5%   Values used to calculate the score:     Age: 14 years     Sex: Male     Is Non-Hispanic African American: No     Diabetic: No     Tobacco smoker: No     Systolic Blood Pressure: 657 mmHg     Is BP treated: No     HDL Cholesterol: 39 mg/dL     Total Cholesterol: 183 mg/dL LDL-122 He declined starting Atorvastatin, prefers TLC- will re-check in 4 months and if not sig improved will again will recommend statin therapy  Healthcare Maintenance: Colonoscopy-GI has called  once to schedule Immunizations-Zoster and Pneumoccocal ordered AAA Screening-UTD, last 08/08/2018- normal LDCT-N/A  Patient Care Team    Relationship Specialty Notifications Start End  Esaw Grandchild, NP PCP - General Family Medicine  07/31/18     Patient Active Problem List   Diagnosis Date Noted  . Unilateral inguinal hernia without obstruction or gangrene 09/02/2018  . Pre-diabetes 09/02/2018  . Elevated LDL cholesterol level 09/02/2018  . Healthcare maintenance 07/31/2018  . Colon cancer screening 07/31/2018  . Encounter for abdominal aortic aneurysm (AAA) screening 07/31/2018     Past Medical History:  Diagnosis Date  . Hypertension      Past Surgical History:  Procedure Laterality Date  . FOOT SURGERY       Family History  Problem Relation Age of Onset  . Arthritis Mother   . Cancer Mother        breast  . Heart attack Father   . Cancer Brother        colon  . Cancer Maternal Grandmother        breast  . Pulmonary embolism Maternal Grandfather   . Cancer Paternal Grandmother        ovarian  . Cancer Paternal Grandfather        prostate     Social History   Substance and Sexual Activity  Drug Use No     Social History   Substance and Sexual Activity  Alcohol  Use Yes   Comment: very seldom     Social History   Tobacco Use  Smoking Status Former Smoker  . Years: 10.00  . Types: Pipe  . Last attempt to quit: 06/19/1978  . Years since quitting: 40.2  Smokeless Tobacco Never Used     No outpatient encounter medications on file as of 09/02/2018.   No facility-administered encounter medications on file as of 09/02/2018.     Allergies: Codeine  Body mass index is 35.6 kg/m.  Blood pressure (!) 160/79, pulse 74, temperature 98.5 F (36.9 C), temperature source Oral, height 5\' 4"  (1.626 m), weight 207 lb 6.4 oz (94.1 kg), SpO2 97 %.  Review of Systems  Constitutional: Positive for fatigue. Negative for activity change, appetite  change, chills, diaphoresis, fever and unexpected weight change.  HENT: Positive for congestion, postnasal drip, rhinorrhea and sinus pressure.   Respiratory: Negative for cough, chest tightness, shortness of breath, wheezing and stridor.   Cardiovascular: Negative for chest pain, palpitations and leg swelling.  Gastrointestinal:       L inguinal hernia ?  Genitourinary: Negative for difficulty urinating and frequency.  Musculoskeletal: Positive for arthralgias.  Skin: Negative for color change, pallor, rash and wound.  Neurological: Negative for dizziness and headaches.  Hematological: Does not bruise/bleed easily.  Psychiatric/Behavioral: Negative for agitation, behavioral problems, confusion, decreased concentration, dysphoric mood, hallucinations, self-injury, sleep disturbance and suicidal ideas. The patient is not nervous/anxious and is not hyperactive.        Objective:   Physical Exam Vitals signs and nursing note reviewed. Exam conducted with a chaperone present.  Constitutional:      General: He is not in acute distress.    Appearance: He is obese. He is not ill-appearing, toxic-appearing or diaphoretic.  HENT:     Head: Normocephalic and atraumatic.     Right Ear: No decreased hearing noted. Tympanic membrane is not erythematous or bulging.     Left Ear: No decreased hearing noted. Tympanic membrane is not erythematous or bulging.     Nose:     Right Turbinates: Not swollen.     Left Turbinates: Not swollen.     Right Sinus: No maxillary sinus tenderness or frontal sinus tenderness.     Left Sinus: No maxillary sinus tenderness or frontal sinus tenderness.     Mouth/Throat:     Dentition: Abnormal dentition. Dental caries present.     Pharynx: Posterior oropharyngeal erythema present. No oropharyngeal exudate.     Tonsils: Swelling: 0 on the right. 0 on the left.  Eyes:     Extraocular Movements: Extraocular movements intact.     Conjunctiva/sclera: Conjunctivae normal.      Pupils: Pupils are equal, round, and reactive to light.  Cardiovascular:     Rate and Rhythm: Normal rate.     Pulses: Normal pulses.     Heart sounds: Normal heart sounds. No murmur. No friction rub. No gallop.   Pulmonary:     Effort: Pulmonary effort is normal. No respiratory distress.     Breath sounds: Normal breath sounds. No stridor. No wheezing, rhonchi or rales.  Chest:     Chest wall: No tenderness.  Abdominal:     General: Abdomen is protuberant.     Tenderness: There is no abdominal tenderness. There is no right CVA tenderness, left CVA tenderness, guarding or rebound.     Hernia: A hernia is present. Hernia is present in the left inguinal area.  Genitourinary:    Comments: Reducible, non-tended  L inguinal hernia Chaperone present during examination. Musculoskeletal: Normal range of motion.        General: No tenderness.  Lymphadenopathy:     Lower Body: No left inguinal adenopathy.  Skin:    General: Skin is warm and dry.     Capillary Refill: Capillary refill takes less than 2 seconds.  Neurological:     Mental Status: He is alert and oriented to person, place, and time.     Coordination: Coordination normal.  Psychiatric:        Mood and Affect: Mood normal.        Behavior: Behavior normal.        Thought Content: Thought content normal.        Judgment: Judgment normal.       Assessment & Plan:   1. Need for influenza vaccination   2. Unilateral inguinal hernia without obstruction or gangrene, recurrence not specified   3. Healthcare maintenance   4. Colon cancer screening   5. Pre-diabetes   6. Elevated LDL cholesterol level     Healthcare maintenance Blood pressure, Blood Glucose, and LDL (BAD) cholesterol all elevated. To lower/improve- follow Heart Healthy Diet and increase regular exercise.  Recommend at least 30 minutes daily, 5 days per week of walking, biking, swimming, YouTube/Pinterest workout videos. Recommend follow-up 4 months- please  come fasting for labs to be drawn. Ultrasound orders placed, re: Possible Left Inguinal Hernia Please call Gastroenterologist to schedule Colonoscopy. Please read through information on Respite Care.  Colon cancer screening Please call Gastroenterologist to schedule Colonoscopy.  Unilateral inguinal hernia without obstruction or gangrene L Inguinal Hernia Present >12 months, gradually increasing in size Non-tender, reducible   Pre-diabetes Lab Results  Component Value Date   HGBA1C 5.9 (H) 08/29/2018  Encouraged to reduce CHO/sugar Increase regular exercise  Elevated LDL cholesterol level The 10-year ASCVD risk score [name], et al., 2013) is: 22.5%   Values used to calculate the score:     Age: 74 years     Sex: Male     Is Non-Hispanic African American: No     Diabetic: No     Tobacco smoker: No     Systolic Blood Pressure: 088 mmHg     Is BP treated: No     HDL Cholesterol: 39 mg/dL     Total Cholesterol: 183 mg/dL  LDL-122 He declined starting Atorvastatin, prefers TLC- will re-check in 4 months and if not sig improved will again will recommend statin therapy     FOLLOW-UP:  Return in about 4 months (around 01/02/2019) for Fasting Labs, HTN, Hypercholestermia.

## 2018-08-29 ENCOUNTER — Other Ambulatory Visit: Payer: PPO

## 2018-08-29 ENCOUNTER — Other Ambulatory Visit: Payer: Self-pay

## 2018-08-29 DIAGNOSIS — Z0000 Encounter for general adult medical examination without abnormal findings: Secondary | ICD-10-CM

## 2018-08-30 LAB — COMPREHENSIVE METABOLIC PANEL
ALBUMIN: 3.9 g/dL (ref 3.8–4.8)
ALT: 14 IU/L (ref 0–44)
AST: 14 IU/L (ref 0–40)
Albumin/Globulin Ratio: 1.6 (ref 1.2–2.2)
Alkaline Phosphatase: 72 IU/L (ref 39–117)
BUN / CREAT RATIO: 15 (ref 10–24)
BUN: 16 mg/dL (ref 8–27)
Bilirubin Total: 0.5 mg/dL (ref 0.0–1.2)
CALCIUM: 9.1 mg/dL (ref 8.6–10.2)
CO2: 24 mmol/L (ref 20–29)
CREATININE: 1.06 mg/dL (ref 0.76–1.27)
Chloride: 102 mmol/L (ref 96–106)
GFR, EST AFRICAN AMERICAN: 84 mL/min/{1.73_m2} (ref >59)
GFR, EST NON AFRICAN AMERICAN: 72 mL/min/{1.73_m2} (ref >59)
GLOBULIN, TOTAL: 2.5 g/dL (ref 1.5–4.5)
Glucose: 95 mg/dL (ref 65–99)
Potassium: 4.4 mmol/L (ref 3.5–5.2)
SODIUM: 137 mmol/L (ref 134–144)
TOTAL PROTEIN: 6.4 g/dL (ref 6.0–8.5)

## 2018-08-30 LAB — LIPID PANEL
CHOLESTEROL TOTAL: 183 mg/dL (ref 100–199)
Chol/HDL Ratio: 4.7 ratio (ref 0.0–5.0)
HDL: 39 mg/dL — AB (ref >39)
LDL Calculated: 122 mg/dL — ABNORMAL HIGH (ref 0–99)
TRIGLYCERIDES: 110 mg/dL (ref 0–149)
VLDL Cholesterol Cal: 22 mg/dL (ref 5–40)

## 2018-08-30 LAB — CBC WITH DIFFERENTIAL/PLATELET
BASOS: 0 %
Basophils Absolute: 0 10*3/uL (ref 0.0–0.2)
EOS (ABSOLUTE): 0.1 10*3/uL (ref 0.0–0.4)
EOS: 1 %
HEMATOCRIT: 42.2 % (ref 37.5–51.0)
HEMOGLOBIN: 14.4 g/dL (ref 13.0–17.7)
Immature Grans (Abs): 0 10*3/uL (ref 0.0–0.1)
Immature Granulocytes: 0 %
LYMPHS ABS: 1.5 10*3/uL (ref 0.7–3.1)
Lymphs: 21 %
MCH: 29.5 pg (ref 26.6–33.0)
MCHC: 34.1 g/dL (ref 31.5–35.7)
MCV: 87 fL (ref 79–97)
MONOCYTES: 8 %
MONOS ABS: 0.6 10*3/uL (ref 0.1–0.9)
NEUTROS ABS: 5 10*3/uL (ref 1.4–7.0)
Neutrophils: 70 %
Platelets: 232 10*3/uL (ref 150–450)
RBC: 4.88 x10E6/uL (ref 4.14–5.80)
RDW: 13 % (ref 11.6–15.4)
WBC: 7.3 10*3/uL (ref 3.4–10.8)

## 2018-08-30 LAB — HEMOGLOBIN A1C
Est. average glucose Bld gHb Est-mCnc: 123 mg/dL
Hgb A1c MFr Bld: 5.9 % — ABNORMAL HIGH (ref 4.8–5.6)

## 2018-08-30 LAB — TSH: TSH: 1.41 u[IU]/mL (ref 0.450–4.500)

## 2018-09-02 ENCOUNTER — Encounter: Payer: Self-pay | Admitting: Adult Health

## 2018-09-02 ENCOUNTER — Ambulatory Visit (INDEPENDENT_AMBULATORY_CARE_PROVIDER_SITE_OTHER): Payer: PPO | Admitting: Adult Health

## 2018-09-02 ENCOUNTER — Other Ambulatory Visit: Payer: Self-pay

## 2018-09-02 VITALS — BP 160/79 | HR 74 | Temp 98.5°F | Ht 64.0 in | Wt 207.4 lb

## 2018-09-02 DIAGNOSIS — K4090 Unilateral inguinal hernia, without obstruction or gangrene, not specified as recurrent: Secondary | ICD-10-CM | POA: Diagnosis not present

## 2018-09-02 DIAGNOSIS — R7303 Prediabetes: Secondary | ICD-10-CM | POA: Diagnosis not present

## 2018-09-02 DIAGNOSIS — Z1211 Encounter for screening for malignant neoplasm of colon: Secondary | ICD-10-CM | POA: Diagnosis not present

## 2018-09-02 DIAGNOSIS — Z23 Encounter for immunization: Secondary | ICD-10-CM

## 2018-09-02 DIAGNOSIS — Z0000 Encounter for general adult medical examination without abnormal findings: Secondary | ICD-10-CM

## 2018-09-02 DIAGNOSIS — E7800 Pure hypercholesterolemia, unspecified: Secondary | ICD-10-CM | POA: Diagnosis not present

## 2018-09-02 NOTE — Assessment & Plan Note (Signed)
Please call Gastroenterologist to schedule Colonoscopy.

## 2018-09-02 NOTE — Patient Instructions (Addendum)

## 2018-09-02 NOTE — Assessment & Plan Note (Signed)
L Inguinal Hernia Present >12 months, gradually increasing in size Non-tender, reducible

## 2018-09-02 NOTE — Assessment & Plan Note (Signed)
Lab Results  Component Value Date   HGBA1C 5.9 (H) 08/29/2018  Encouraged to reduce CHO/sugar Increase regular exercise

## 2018-09-02 NOTE — Assessment & Plan Note (Signed)
The 10-year ASCVD risk score Mikey Bussing DC Brooke Bonito., et al., 2013) is: 22.5%   Values used to calculate the score:     Age: 68 years     Sex: Male     Is Non-Hispanic African American: No     Diabetic: No     Tobacco smoker: No     Systolic Blood Pressure: 068 mmHg     Is BP treated: No     HDL Cholesterol: 39 mg/dL     Total Cholesterol: 183 mg/dL  LDL-122 He declined starting Atorvastatin, prefers TLC- will re-check in 4 months and if not sig improved will again will recommend statin therapy

## 2018-09-02 NOTE — Assessment & Plan Note (Signed)
Blood pressure, Blood Glucose, and LDL (BAD) cholesterol all elevated. To lower/improve- follow Heart Healthy Diet and increase regular exercise.  Recommend at least 30 minutes daily, 5 days per week of walking, biking, swimming, YouTube/Pinterest workout videos. Recommend follow-up 4 months- please come fasting for labs to be drawn. Ultrasound orders placed, re: Possible Left Inguinal Hernia Please call Gastroenterologist to schedule Colonoscopy. Please read through information on Respite Care.

## 2018-09-16 ENCOUNTER — Other Ambulatory Visit: Payer: PPO

## 2018-11-05 ENCOUNTER — Other Ambulatory Visit: Payer: PPO

## 2018-12-28 NOTE — Progress Notes (Signed)
Subjective:    Patient ID: John Franco, male    DOB: [birth year], 68 y.o.   MRN: [id number]  HPI: 07/31/2018 OV:   Mr. John Franco is here to establish as a new pt.  He is a pleasant 68 year old male. 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 He reports significant stress, however denies depression and declines referral to [facility]. He enjoys going for a walk to relieve stress and "to take a break". He has not been checking his BP/HR at home, yet has a BP cuff at home He denies tobacco/vape/excessive ETOH use He has a  44 yr old son living at home, whom is not working- encouraged to ask his son to assist around house with chores, etc    Patient Care Team    Relationship Specialty Notifications Start End  Mikela Senn, Berna Spare, NP PCP - General Family Medicine  07/31/18     Patient Active Problem List   Diagnosis Date Noted  . Elevated BP without diagnosis of hypertension 01/06/2019  . Unilateral inguinal hernia without obstruction or gangrene 09/02/2018  . Pre-diabetes 09/02/2018  . Elevated LDL cholesterol level 09/02/2018  . Healthcare maintenance 07/31/2018  . Colon cancer screening 07/31/2018  . Encounter for abdominal aortic aneurysm (AAA) screening 07/31/2018     Past Medical History:  Diagnosis Date  . Hypertension      Past Surgical History:  Procedure Laterality Date  . FOOT SURGERY       Family History  Problem Relation Age of Onset  . Arthritis Mother   . Cancer Mother        breast  . Heart attack Father   . Cancer Brother        colon  . Cancer Maternal Grandmother        breast  . Pulmonary embolism Maternal Grandfather   . Cancer Paternal Grandmother        ovarian  . Cancer Paternal Grandfather        prostate     Social History   Substance and Sexual Activity  Drug Use No     Social History   Substance and Sexual Activity  Alcohol Use Yes   Comment: very seldom     Social History   Tobacco Use  Smoking Status Former Smoker  . Years: 10.00  . Types: Pipe  . Quit date: 06/19/1978  . Years since quitting: 40.5  Smokeless Tobacco Never Used     No outpatient encounter medications on file as of 01/06/2019.   No facility-administered encounter medications on file as of 01/06/2019.     Allergies: Codeine  Body mass index is 36.96 kg/m.  Blood pressure 136/74, pulse 64, temperature 98.4 F (36.9 C), temperature source Oral, height 5\' 4"  (1.626 m), weight 215 lb 4.8 oz (97.7 kg), SpO2 98 %.  Review of Systems  Constitutional: Positive for  fatigue. Negative for activity change, appetite change, chills, diaphoresis, fever and unexpected weight change.  Eyes: Negative for visual disturbance.  Respiratory: Negative for cough, chest tightness, shortness of breath, wheezing and stridor.   Cardiovascular: Negative for chest pain, palpitations and leg swelling.  Neurological: Negative for dizziness and headaches.  Hematological: Negative for adenopathy. Does not bruise/bleed easily.  Psychiatric/Behavioral: Positive for sleep disturbance. Negative for agitation, behavioral problems, confusion, decreased concentration, dysphoric mood, hallucinations, self-injury and suicidal ideas. The patient is not nervous/anxious and is not hyperactive.        Stress +       Objective:   Physical Exam Vitals signs and nursing note reviewed.  Constitutional:      General: He is not in acute distress.    Appearance: He is obese. He is not ill-appearing, toxic-appearing or diaphoretic.  HENT:     Head: Normocephalic and atraumatic.  Cardiovascular:     Rate and Rhythm: Normal rate and regular rhythm.     Pulses: Normal pulses.     Heart sounds: Normal heart sounds. No murmur. No friction rub. No gallop.   Pulmonary:     Effort: Pulmonary effort is normal. No respiratory distress.     Breath sounds: Normal breath sounds. No stridor. No wheezing, rhonchi or rales.  Chest:     Chest wall: No tenderness.  Skin:    Capillary Refill: Capillary refill takes less than 2 seconds.  Neurological:     Mental Status: He is alert and oriented to person, place, and time.  Psychiatric:        Mood and Affect: Mood normal.        Behavior: Behavior normal.        Thought Content: Thought content normal.        Judgment: Judgment normal.       Assessment & Plan:   1. Pre-diabetes   2. Elevated LDL cholesterol level   3. Fatigue, unspecified type   4. Healthcare maintenance   5. Elevated BP without diagnosis of hypertension     Pre-diabetes Lab  Results  Component Value Date   HGBA1C 5.9 (H) 08/29/2018  A1c re-checked today  Healthcare maintenance We will call you when lab results are available. Please check your blood pressure and heart rate several times per week. If reading is consistently >140/90, then call clinic. Remain well hydrated and follow heart healthy diet. Try to take a break and walk 10-15 mins twice daily. Ask your son for assistance around the house/daoly chores. Follow-up in 4 months. Please call us with any care needs for your wife. Continue to social distance and wear a mask when in public.  Elevated LDL cholesterol level Lipid Panel checked today   Elevated BP without diagnosis of hypertension Please check your blood  pressure and heart rate several times per week. If reading is consistently >140/90, then call clinic.     FOLLOW-UP:  Return in about 4 months (around 05/09/2019) for Regular Follow Up, Elevated Blood Pressure.

## 2019-01-06 ENCOUNTER — Encounter: Payer: Self-pay | Admitting: Adult Health

## 2019-01-06 ENCOUNTER — Other Ambulatory Visit: Payer: Self-pay

## 2019-01-06 ENCOUNTER — Ambulatory Visit (INDEPENDENT_AMBULATORY_CARE_PROVIDER_SITE_OTHER): Payer: PPO | Admitting: Adult Health

## 2019-01-06 VITALS — BP 136/74 | HR 64 | Temp 98.4°F | Ht 64.0 in | Wt 215.3 lb

## 2019-01-06 DIAGNOSIS — R7303 Prediabetes: Secondary | ICD-10-CM | POA: Diagnosis not present

## 2019-01-06 DIAGNOSIS — R030 Elevated blood-pressure reading, without diagnosis of hypertension: Secondary | ICD-10-CM | POA: Diagnosis not present

## 2019-01-06 DIAGNOSIS — R5383 Other fatigue: Secondary | ICD-10-CM | POA: Diagnosis not present

## 2019-01-06 DIAGNOSIS — E7800 Pure hypercholesterolemia, unspecified: Secondary | ICD-10-CM | POA: Diagnosis not present

## 2019-01-06 DIAGNOSIS — Z0000 Encounter for general adult medical examination without abnormal findings: Secondary | ICD-10-CM

## 2019-01-06 NOTE — Patient Instructions (Signed)
Preventing Hypertension Hypertension, commonly called high blood pressure, is when the force of blood pumping through the arteries is too strong. Arteries are blood vessels that carry blood from the heart throughout the body. Over time, hypertension can damage the arteries and decrease blood flow to important parts of the body, including the brain, heart, and kidneys. Often, hypertension does not cause symptoms until blood pressure is very high. For this reason, it is important to have your blood pressure checked on a regular basis. Hypertension can often be prevented with diet and lifestyle changes. If you already have hypertension, you can control it with diet and lifestyle changes, as well as medicine. What nutrition changes can be made? Maintain a healthy diet. This includes:  Eating less salt (sodium). Ask your health care provider how much sodium is safe for you to have. The general recommendation is to consume less than 1 tsp (2,300 mg) of sodium a day. ? Do not add salt to your food. ? Choose low-sodium options when grocery shopping and eating out.  Limiting fats in your diet. You can do this by eating low-fat or fat-free dairy products and by eating less red meat.  Eating more fruits, vegetables, and whole grains. Make a goal to eat: ? 1-2 cups of fresh fruits and vegetables each day. ? 3-4 servings of whole grains each day.  Avoiding foods and beverages that have added sugars.  Eating fish that contain healthy fats (omega-3 fatty acids), such as mackerel or salmon. If you need help putting together a healthy eating plan, try the DASH diet. This diet is high in fruits, vegetables, and whole grains. It is low in sodium, red meat, and added sugars. DASH stands for Dietary Approaches to Stop Hypertension. What lifestyle changes can be made?   Lose weight if you are overweight. Losing just 3?5% of your body weight can help prevent or control hypertension. ? For example, if your present  weight is 200 lb (91 kg), a loss of 3-5% of your weight means losing 6-10 lb (2.7-4.5 kg). ? Ask your health care provider to help you with a diet and exercise plan to safely lose weight.  Get enough exercise. Do at least 150 minutes of moderate-intensity exercise each week. ? You could do this in short exercise sessions several times a day, or you could do longer exercise sessions a few times a week. For example, you could take a brisk 10-minute walk or bike ride, 3 times a day, for 5 days a week.  Find ways to reduce stress, such as exercising, meditating, listening to music, or taking a yoga class. If you need help reducing stress, ask your health care provider.  Do not smoke. This includes e-cigarettes. Chemicals in tobacco and nicotine products raise your blood pressure each time you smoke. If you need help quitting, ask your health care provider.  Avoid alcohol. If you drink alcohol, limit alcohol intake to no more than 1 drink a day for nonpregnant women and 2 drinks a day for men. One drink equals 12 oz of beer, 5 oz of wine, or 1 oz of hard liquor. Why are these changes important? Diet and lifestyle changes can help you prevent hypertension, and they may make you feel better overall and improve your quality of life. If you have hypertension, making these changes will help you control it and help prevent major complications, such as:  Hardening and narrowing of arteries that supply blood to: ? Your heart. This can cause a heart  attack. ? Your brain. This can cause a stroke. ? Your kidneys. This can cause kidney failure.  Stress on your heart muscle, which can cause heart failure. What can I do to lower my risk?  Work with your health care provider to make a hypertension prevention plan that works for you. Follow your plan and keep all follow-up visits as told by your health care provider.  Learn how to check your blood pressure at home. Make sure that you know your personal target  blood pressure, as told by your health care provider. How is this treated? In addition to diet and lifestyle changes, your health care provider may recommend medicines to help lower your blood pressure. You may need to try a few different medicines to find what works best for you. You also may need to take more than one medicine. Take over-the-counter and prescription medicines only as told by your health care provider. Where to find support Your health care provider can help you prevent hypertension and help you keep your blood pressure at a healthy level. Your local hospital or your community may also provide support services and prevention programs. The American Heart Association offers an online support network at: CheapBootlegs.com.cy Where to find more information Learn more about hypertension from:  [place], Lung, and Blood Institute: ElectronicHangman.is  Centers for Disease Control and Prevention: https://ingram.com/  American Academy of Family Physicians: http://familydoctor.org/familydoctor/en/diseases-conditions/high-blood-pressure.printerview.all.html Learn more about the DASH diet from:  [place], Lung, and [place]: https://www.reyes.com/ Contact a health care provider if:  You think you are having a reaction to medicines you have taken.  You have recurrent headaches or feel dizzy.  You have swelling in your ankles.  You have trouble with your vision. Summary  Hypertension often does not cause any symptoms until blood pressure is very high. It is important to get your blood pressure checked regularly.  Diet and lifestyle changes are the most important steps in preventing hypertension.  By keeping your blood pressure in a healthy range, you can prevent complications like heart attack, heart failure, stroke, and kidney failure.  Work with your health care  provider to make a hypertension prevention plan that works for you. This information is not intended to replace advice given to you by your health care provider. Make sure you discuss any questions you have with your health care provider. Document Released: 06/20/2015 Document Revised: 09/27/2018 Document Reviewed: 02/14/2016 Elsevier Patient Education  2020 [employer].   We will call you when lab results are available. Please check your blood pressure and heart rate several times per week. If reading is consistently >140/90, then call clinic. Remain well hydrated and follow heart healthy diet. Try to take a break and walk 10-15 mins twice daily. Ask your son for assistance around the house/daoly chores. Follow-up in 4 months. Please call us with any care needs for your wife. Continue to social distance and wear a mask when in public. GREAT TO SEE YOU!

## 2019-01-06 NOTE — Assessment & Plan Note (Signed)
Please check your blood pressure and heart rate several times per week. If reading is consistently >140/90, then call clinic.

## 2019-01-06 NOTE — Assessment & Plan Note (Signed)
We will call you when lab results are available. Please check your blood pressure and heart rate several times per week. If reading is consistently >140/90, then call clinic. Remain well hydrated and follow heart healthy diet. Try to take a break and walk 10-15 mins twice daily. Ask your son for assistance around the house/daoly chores. Follow-up in 4 months. Please call us with any care needs for your wife. Continue to social distance and wear a mask when in public.

## 2019-01-06 NOTE — Assessment & Plan Note (Signed)
Lab Results  Component Value Date   HGBA1C 5.9 (H) 08/29/2018  A1c re-checked today

## 2019-01-06 NOTE — Assessment & Plan Note (Signed)
Lipid Panel checked today

## 2019-01-07 ENCOUNTER — Other Ambulatory Visit: Payer: Self-pay | Admitting: Adult Health

## 2019-01-07 LAB — VITAMIN D 25 HYDROXY (VIT D DEFICIENCY, FRACTURES): Vit D, 25-Hydroxy: 23.8 ng/mL — ABNORMAL LOW (ref 30.0–100.0)

## 2019-01-07 LAB — LIPID PANEL
Chol/HDL Ratio: 4.9 ratio (ref 0.0–5.0)
Cholesterol, Total: 215 mg/dL — ABNORMAL HIGH (ref 100–199)
HDL: 44 mg/dL (ref >39)
LDL Calculated: 148 mg/dL — ABNORMAL HIGH (ref 0–99)
Triglycerides: 114 mg/dL (ref 0–149)
VLDL Cholesterol Cal: 23 mg/dL (ref 5–40)

## 2019-01-07 LAB — HEMOGLOBIN A1C
Est. average glucose Bld gHb Est-mCnc: 123 mg/dL
Hgb A1c MFr Bld: 5.9 % — ABNORMAL HIGH (ref 4.8–5.6)

## 2019-01-07 MED ORDER — VITAMIN D (ERGOCALCIFEROL) 1.25 MG (50000 UNIT) PO CAPS: 50000.0000 [IU] | ORAL_CAPSULE | ORAL | 0 refills | Status: DC

## 2019-04-28 NOTE — Progress Notes (Deleted)
Subjective:    Patient ID: Collen Jenerette, male    DOB: 06-16-1951, 68 y.o.   MRN: [id number]  HPI: 07/31/2018 OV: Mr. Krasner is here to establish as a new pt. He is a pleasant 68 year old male. 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 He reports significant stress, however denies depression and declines referral to [facility]. He enjoys going for a walk to relieve stress and "to take a break". He has not been checking his BP/HR at home, yet has a BP cuff at home He denies tobacco/vape/excessive ETOH use He has a  69 yr old son living at home, whom is not working- encouraged to ask his son to assist around house with chores, etc   04/30/2019 OV: Mr. Mohammad is here for 4 month f/u:  Needs Lipid panel 01/06/2019- recommended starting Atorvastatin 20mg  QD- pt called 3x, then letter mailed The 10-year ASCVD risk score Mikey Bussing DC Brooke Bonito., et al., 2013) is: 19.2%   Values used to calculate the score:     Age: 6 years     Sex: Male     Is Non-Hispanic African American: No     Diabetic: No     Tobacco smoker: No     Systolic Blood Pressure: [id number] mmHg     Is BP treated: No     HDL Cholesterol: 44 mg/dL     Total Cholesterol: 215 mg/dL  [id number] Patient Care Team    Relationship Specialty Notifications Start End  Esaw Grandchild, NP PCP - General Family Medicine  07/31/18     Patient Active Problem List   Diagnosis Date Noted  . Elevated BP without diagnosis of hypertension 01/06/2019  . Unilateral inguinal hernia without obstruction or gangrene 09/02/2018  . Pre-diabetes 09/02/2018  . Elevated LDL cholesterol level 09/02/2018  . Healthcare maintenance 07/31/2018  . Colon cancer screening 07/31/2018  . Encounter for abdominal aortic aneurysm (AAA) screening 07/31/2018     Past Medical History:  Diagnosis Date  . Hypertension      Past Surgical History:  Procedure Laterality Date  . FOOT SURGERY       Family History  Problem Relation Age  of Onset  . Arthritis Mother   . Cancer Mother        breast  . Heart attack Father   . Cancer Brother        colon  . Cancer Maternal Grandmother        breast  . Pulmonary embolism Maternal Grandfather   . Cancer Paternal Grandmother        ovarian  . Cancer Paternal Grandfather        prostate     Social History   Substance and Sexual Activity  Drug Use No     Social History   Substance and Sexual Activity  Alcohol Use Yes   Comment: very seldom     Social History   Tobacco Use  Smoking Status Former Smoker  . Years: 10.00   . Types: Pipe  . Quit date: 06/19/1978  . Years since quitting: 40.8  Smokeless Tobacco Never Used     Outpatient Encounter Medications as of 04/30/2019  Medication Sig  . Vitamin D, Ergocalciferol, (DRISDOL) 1.25 MG (50000 UT) CAPS capsule Take 1 capsule (50,000 Units total) by mouth every 7 (seven) days.   No facility-administered encounter medications on file as of 04/30/2019.     Allergies: Codeine  There is no height or weight on file to calculate BMI.  There were no vitals taken for this visit.     Review of Systems     Objective:   Physical Exam        Assessment & Plan:  No diagnosis found.  No problem-specific Assessment & Plan notes found for this encounter.    FOLLOW-UP:  No follow-ups on file.

## 2019-04-29 ENCOUNTER — Other Ambulatory Visit: Payer: Self-pay | Admitting: Adult Health

## 2019-04-30 ENCOUNTER — Ambulatory Visit: Payer: PPO | Admitting: Adult Health

## 2019-06-14 IMAGING — CR DG SHOULDER 2+V*L*
2 series · 2 of 2 positions shown · non-contrast
Comparison: Chest x-ray 11/15/2012

CLINICAL DATA: Fall today with left shoulder pain.

EXAM:
LEFT SHOULDER - 2+ VIEW

[shoulder grashey]
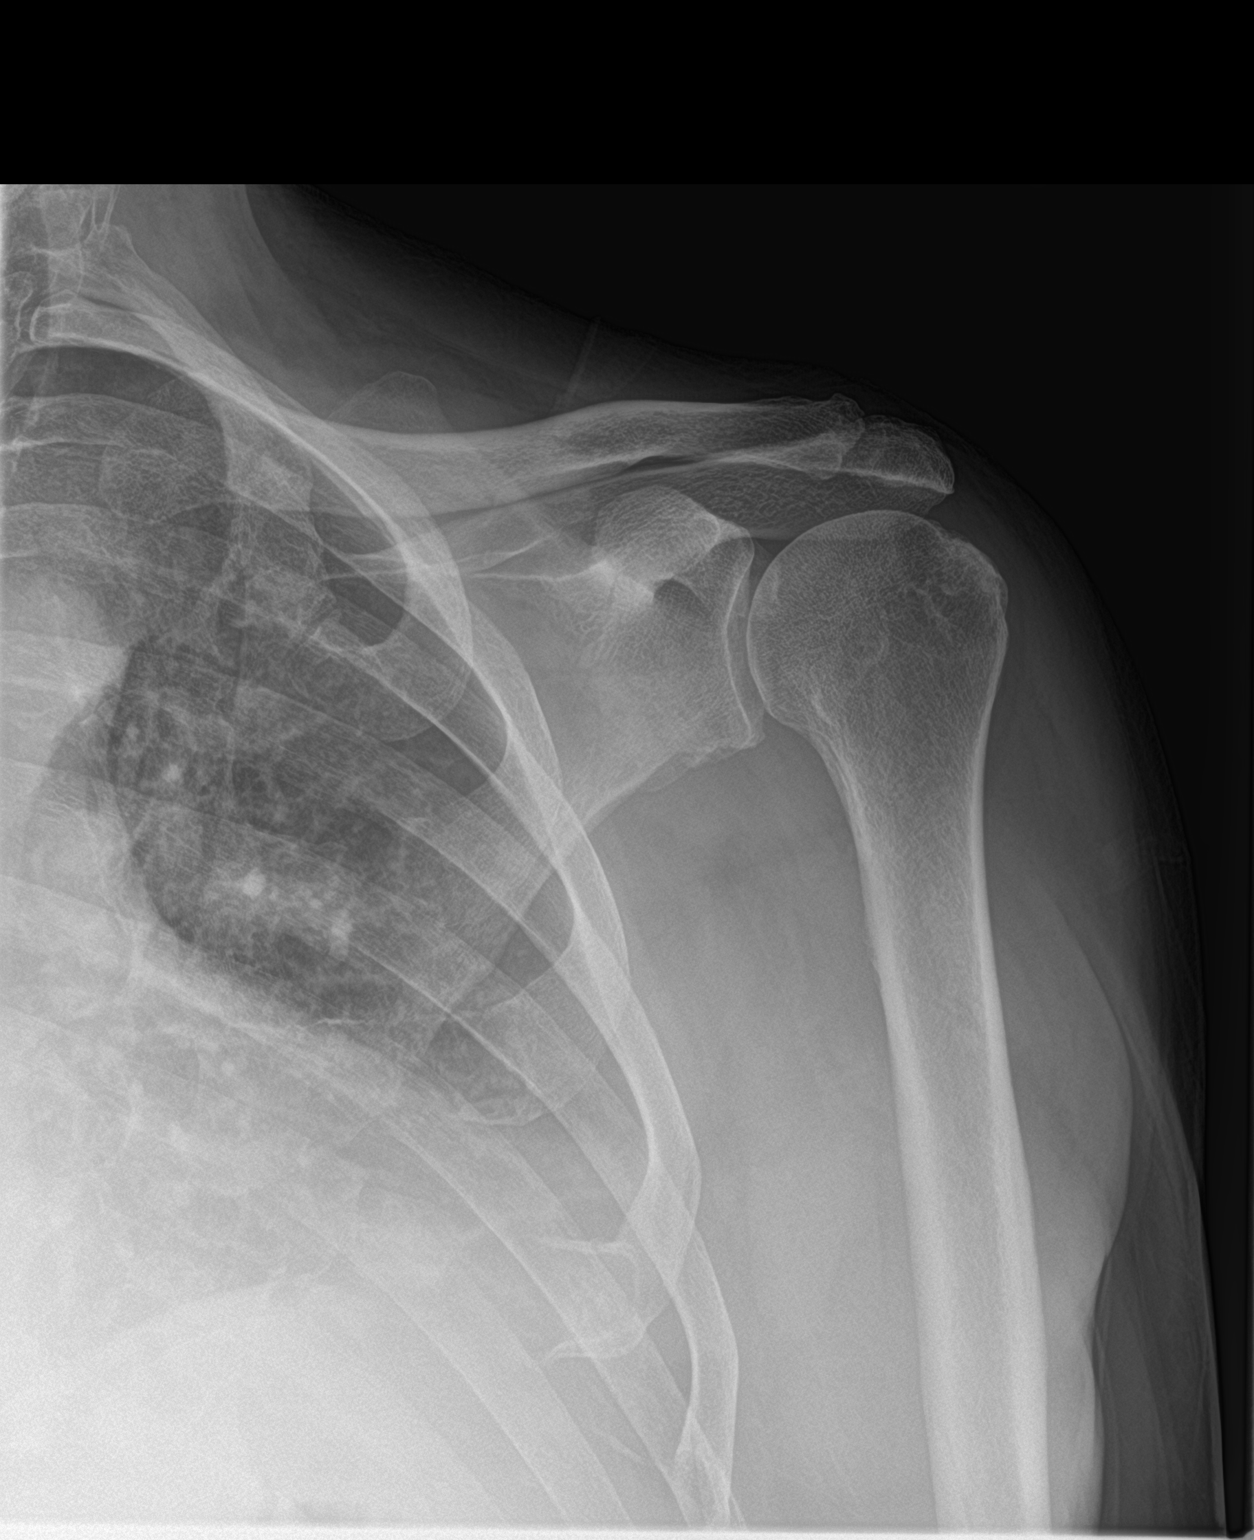

[shoulder y view]
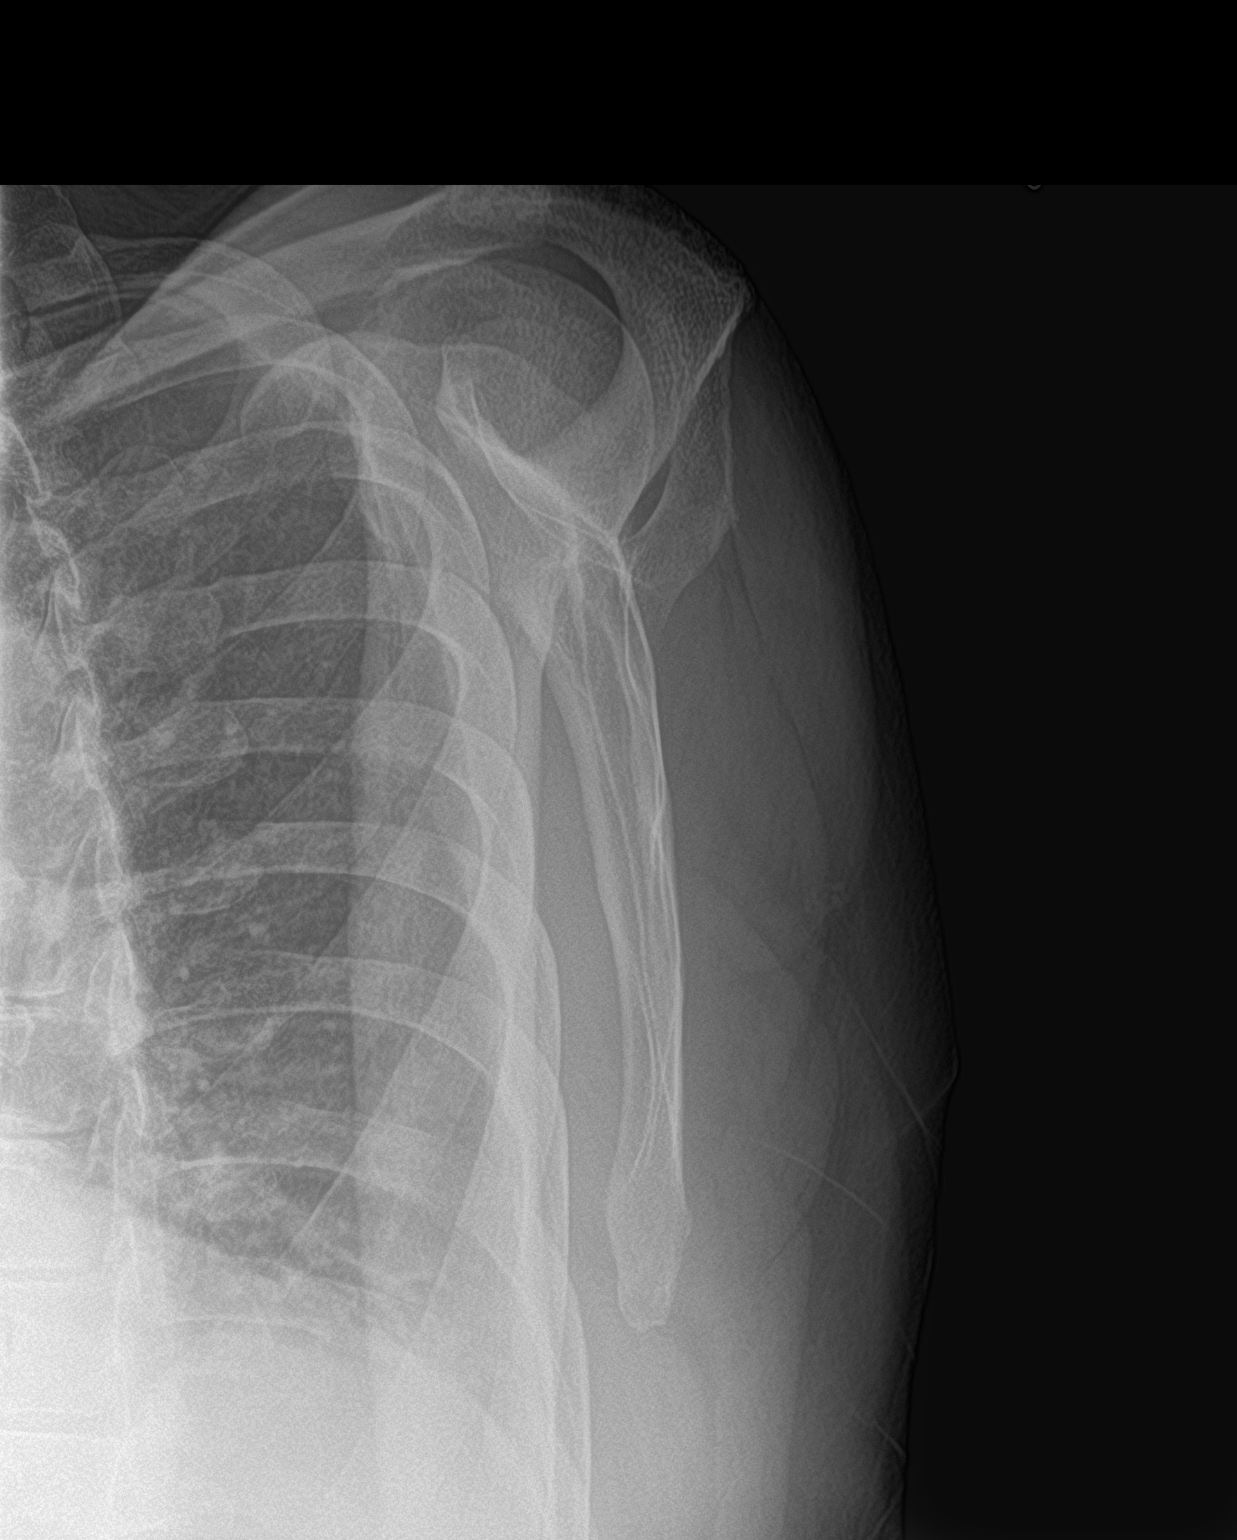

[2 of 2 positions shown; findings below may reference images not displayed]

FINDINGS: Mild degenerate change of the AC joint and glenohumeral joints. No
evidence of acute fracture or dislocation.
IMPRESSION: No acute findings.

## 2020-09-23 ENCOUNTER — Other Ambulatory Visit: Payer: Self-pay

## 2020-09-23 ENCOUNTER — Encounter: Payer: Self-pay | Admitting: Physician Assistant

## 2020-09-23 ENCOUNTER — Ambulatory Visit (INDEPENDENT_AMBULATORY_CARE_PROVIDER_SITE_OTHER): Payer: PPO | Admitting: Physician Assistant

## 2020-09-23 VITALS — BP 148/78 | HR 66 | Temp 98.3°F | Ht 64.0 in | Wt 214.1 lb

## 2020-09-23 DIAGNOSIS — L03116 Cellulitis of left lower limb: Secondary | ICD-10-CM

## 2020-09-23 DIAGNOSIS — L97921 Non-pressure chronic ulcer of unspecified part of left lower leg limited to breakdown of skin: Secondary | ICD-10-CM

## 2020-09-23 DIAGNOSIS — R030 Elevated blood-pressure reading, without diagnosis of hypertension: Secondary | ICD-10-CM

## 2020-09-23 MED ORDER — DOXYCYCLINE HYCLATE 100 MG PO TABS: 100.0000 mg | ORAL_TABLET | Freq: Two times a day (BID) | ORAL | 0 refills | Status: DC

## 2020-09-23 NOTE — Progress Notes (Signed)
Acute Office Visit  Subjective:    Patient ID: John Franco, male    DOB: [birth year], 70 y.o.   MRN: [id number]  Chief Complaint  Patient presents with  . Leg Pain    HPI Patient is in today for c/o of lower extremity wounds of left lower leg and swelling. Patient reports has applied topical antibiotic which has helped and no longer draining. Patient has not taken a shower in 3 weeks until yesterday. Denies fever, chills, night sweats, trauma/injury, chest pain or palpitations. Also reports has removed 3 deer ticks recently.  Past Medical History:  Diagnosis Date  . Hypertension     Past Surgical History:  Procedure Laterality Date  . FOOT SURGERY      Family History  Problem Relation Age of Onset  . Arthritis Mother   . Cancer Mother        breast  . Heart attack Father   . Cancer Brother        colon  . Cancer Maternal Grandmother        breast  . Pulmonary embolism Maternal Grandfather   . Cancer Paternal Grandmother        ovarian  . Cancer Paternal Grandfather        prostate    Social History   Socioeconomic History  . Marital status: Married    Spouse name: Not on file  . Number of children: Not on file  . Years of education: Not on file  . Highest education level: Not on file  Occupational History  . Not on file  Tobacco Use  . Smoking status: Former Smoker    Years: 10.00    Types: Pipe    Quit date: 06/19/1978    Years since quitting: 42.2  . Smokeless tobacco: Never Used  Vaping Use  . Vaping Use: Never used  Substance and Sexual Activity  . Alcohol use: Yes    Comment: very seldom  . Drug use: No  . Sexual activity: Not Currently    Birth control/protection: Abstinence  Other Topics Concern  . Not on file  Social History Narrative  . Not on file   Social Determinants of Health   Financial Resource Strain: Not on file  Food Insecurity: Not on file  Transportation Needs: Not on file  Physical Activity: Not on file  Stress: Not on  file  Social Connections: Not on file  Intimate Partner Violence: Not on file    Outpatient Medications Prior to Visit  Medication Sig Dispense Refill  . Vitamin D, Ergocalciferol, (DRISDOL) 1.25 MG (50000 UT) CAPS capsule Take 1 capsule (50,000 Units total) by mouth every 7 (seven) days. (Patient not taking: Reported on 09/23/2020) 16 capsule 0   No facility-administered medications prior to visit.    Allergies  Allergen Reactions  . Codeine Other (See Comments)    jittery    Review of Systems  A fourteen system review of systems was performed and found to be positive as per HPI.    Objective:    Physical Exam General:  WDWN, in no acute distress, non-toxic appearing, poor hygiene   Neuro:  Alert and oriented,  extra-ocular muscles intact  HEENT:  Normocephalic, atraumatic, neck supple  Skin:  Multiple superficial ulcers in a semi-circular pattern with surrounding erythema and warmth. Cardiac:  RRR Respiratory: Not using accessory muscles, speaking in full sentences- unlabored. Vascular:  Ext warm, no cyanosis apprec.; +edema  Psych:  No HI/SI, judgement and insight good, Euthymic mood. Full  Affect.  BP (!) 148/78   Pulse 66   Temp 98.3 F (36.8 C)   Ht 5\' 4"  (1.626 m)   Wt 214 lb 1.6 oz (97.1 kg)   SpO2 97%   BMI 36.75 kg/m  Wt Readings from Last 3 Encounters:  09/23/20 214 lb 1.6 oz (97.1 kg)  01/06/19 215 lb 4.8 oz (97.7 kg)  09/02/18 207 lb 6.4 oz (94.1 kg)    Health Maintenance Due  Topic Date Due  . Hepatitis C Screening  Never done  . COVID-19 Vaccine (1) Never done  . TETANUS/TDAP  Never done  . COLONOSCOPY (Pts 45-15yrs Insurance coverage will need to be confirmed)  Never done  . PNA vac Low Risk Adult (1 of 2 - PCV13) Never done    There are no preventive care reminders to display for this patient.   Lab Results  Component Value Date   TSH 1.410 08/29/2018   Lab Results  Component Value Date   WBC 7.3 08/29/2018   HGB 14.4 08/29/2018   HCT  42.2 08/29/2018   MCV 87 08/29/2018   PLT 232 08/29/2018   Lab Results  Component Value Date   NA 137 08/29/2018   K 4.4 08/29/2018   CO2 24 08/29/2018   GLUCOSE 95 08/29/2018   BUN 16 08/29/2018   CREATININE 1.06 08/29/2018   BILITOT 0.5 08/29/2018   ALKPHOS 72 08/29/2018   AST 14 08/29/2018   ALT 14 08/29/2018   PROT 6.4 08/29/2018   ALBUMIN 3.9 08/29/2018   CALCIUM 9.1 08/29/2018   Lab Results  Component Value Date   CHOL 215 (H) 01/06/2019   Lab Results  Component Value Date   HDL 44 01/06/2019   Lab Results  Component Value Date   LDLCALC 148 (H) 01/06/2019   Lab Results  Component Value Date   TRIG 114 01/06/2019   Lab Results  Component Value Date   CHOLHDL 4.9 01/06/2019   Lab Results  Component Value Date   HGBA1C 5.9 (H) 01/06/2019       Assessment & Plan:   Problem List Items Addressed This Visit   None   Visit Diagnoses    Cellulitis of left lower extremity    -  Primary   Relevant Medications   doxycycline (VIBRA-TABS) 100 MG tablet   Ulcer of left lower extremity, limited to breakdown of skin (HCC)         Cellulitis of left lower extremity: -Patient with history of poor hygiene with erythema, swelling and warmth of left lower extremity surrounding ulcers so will start antibiotic therapy for cellulitis.  -Discussed good hygiene practices.  -Follow up in 1 week for re-evaluation.   Ulcer of left lower extremity, limited to breakdown of skin: -Ulcers are healing, recommend to continue topical antibiotic and advised to use compression socks. -Keep area clean and dry. -Follow up in 1 week for re-evaluation.  Elevated blood pressure reading without diagnosis of hypertension: -Patient asymptomatic. -BP elevated. BP repeat mildly improved. Will reassess/repeat at follow up visit in 1 week.   Meds ordered this encounter  Medications  . doxycycline (VIBRA-TABS) 100 MG tablet    Sig: Take 1 tablet (100 mg total) by mouth 2 (two) times  daily.    Dispense:  20 tablet    Refill:  0    Order Specific Question:   Supervising Provider    Answer:   Beatrice Lecher D [[id number]]     Lorrene Reid, PA-C

## 2020-09-28 IMAGING — US US ABDOMINAL AORTA SCREENING AAA
1 series · 14 of 18 positions shown · non-contrast
Comparison: None.

CLINICAL DATA: Male between 65-75 years of age with a smoking
history.

EXAM:
US ABDOMINAL AORTA MEDICARE SCREENING
TECHNIQUE: Ultrasound examination of the abdominal aorta was performed as a
screening evaluation for abdominal aortic aneurysm.

[Series 1: us abdominal aorta screening aaa · 0.30mm/px · 14 of 18 slices shown]
[im 1/18]
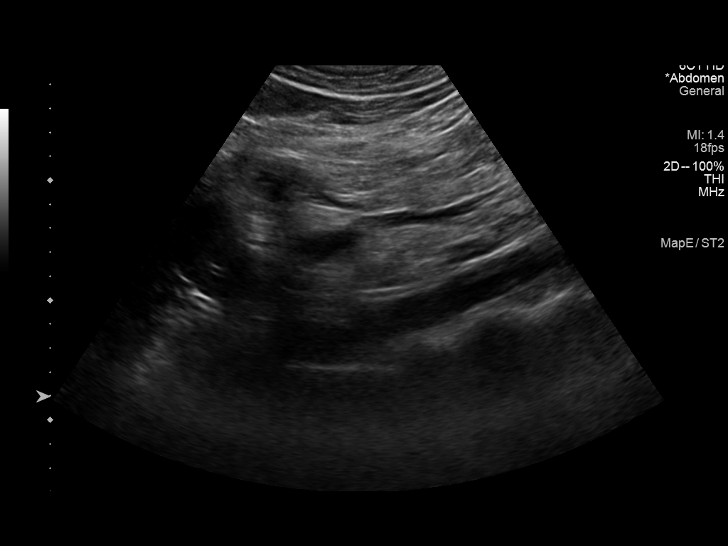
[im 2/18]
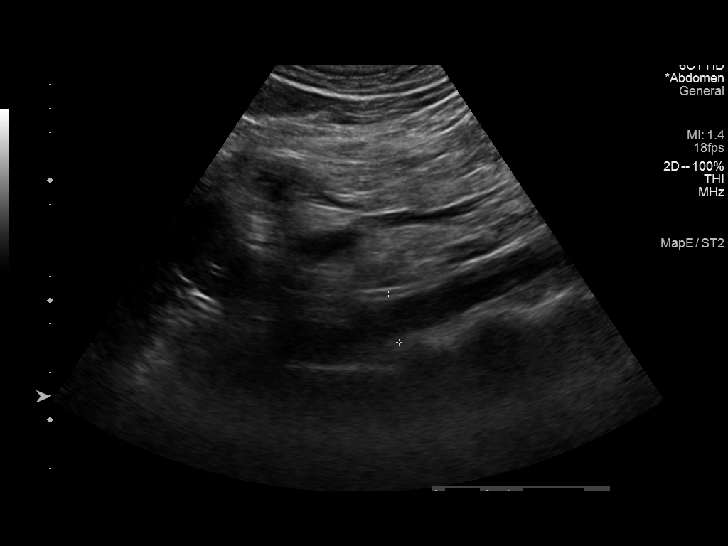
[im 4/18]
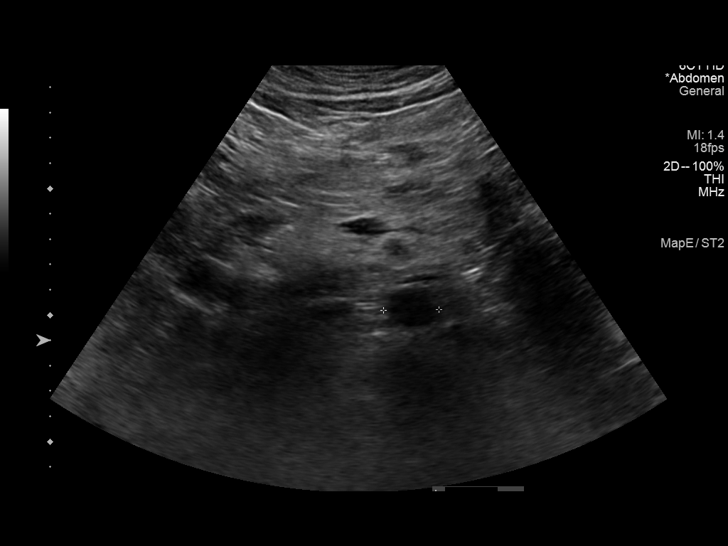
[im 5/18]
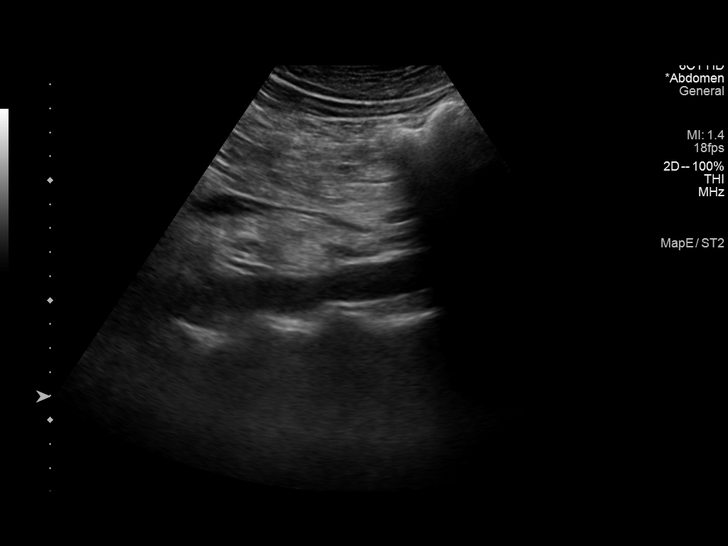
[im 6/18]
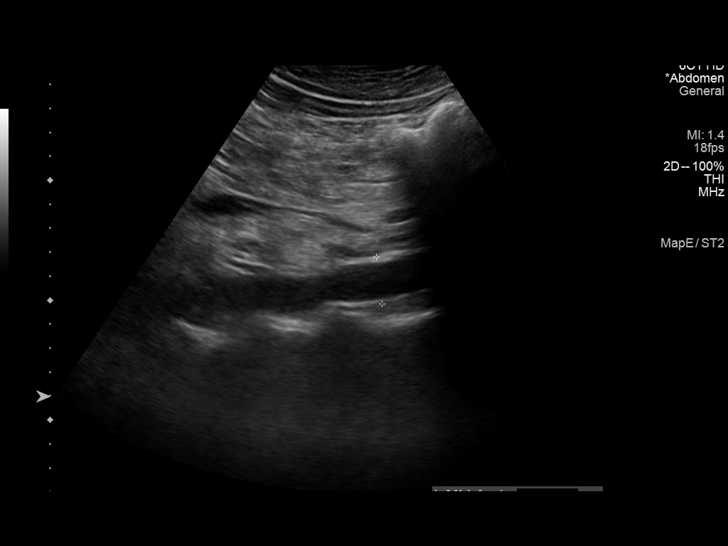
[im 8/18]
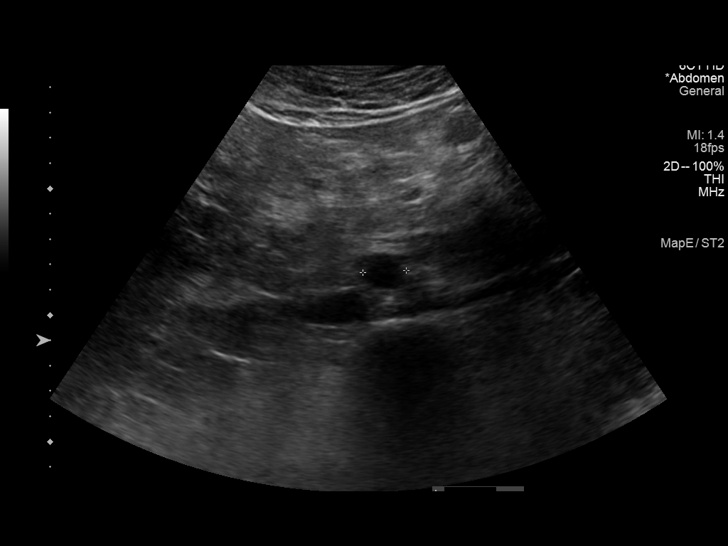
[im 9/18]
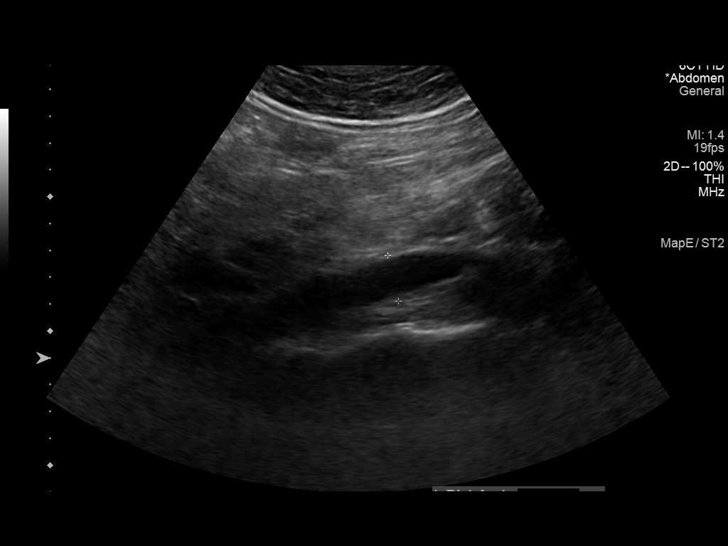
[im 10/18]
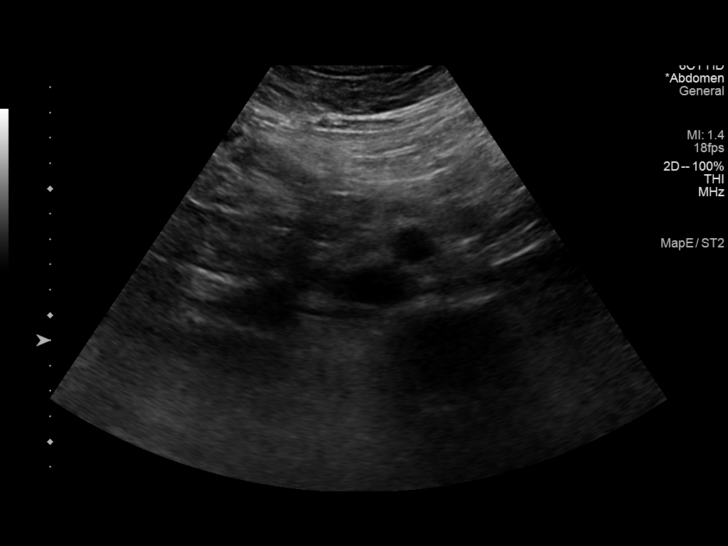
[im 11/18]
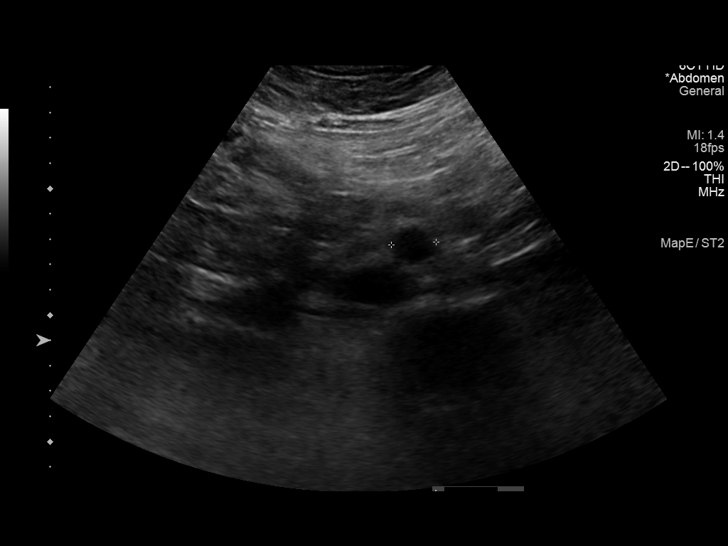
[im 13/18]
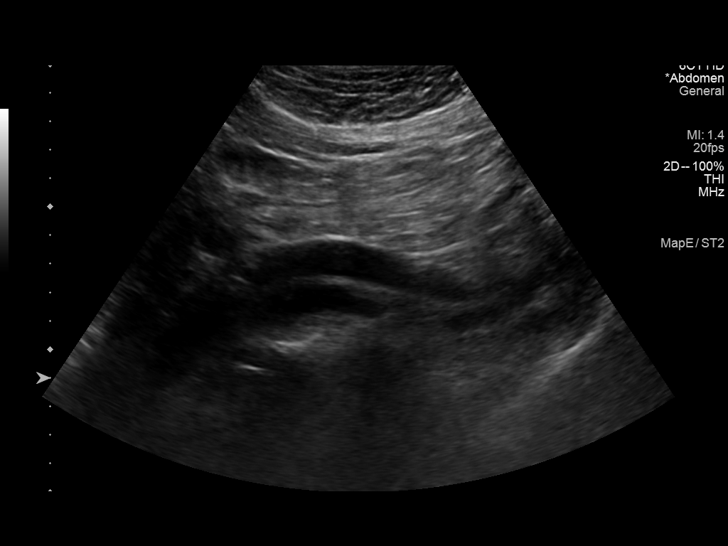
[im 14/18]
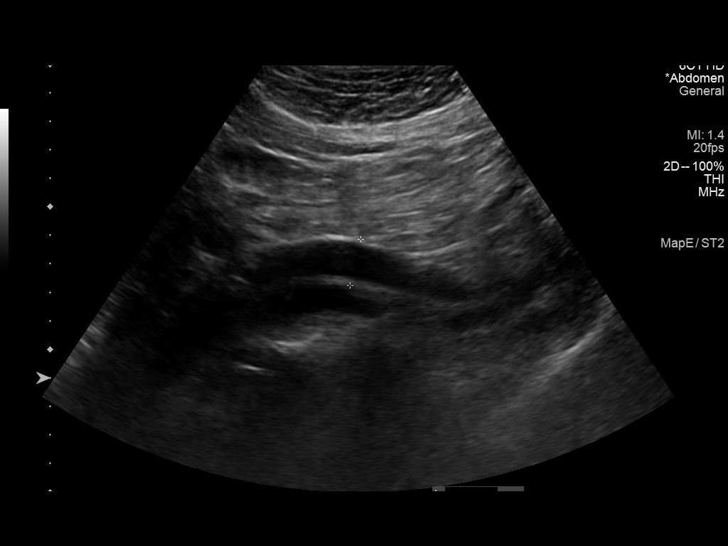
[im 15/18]
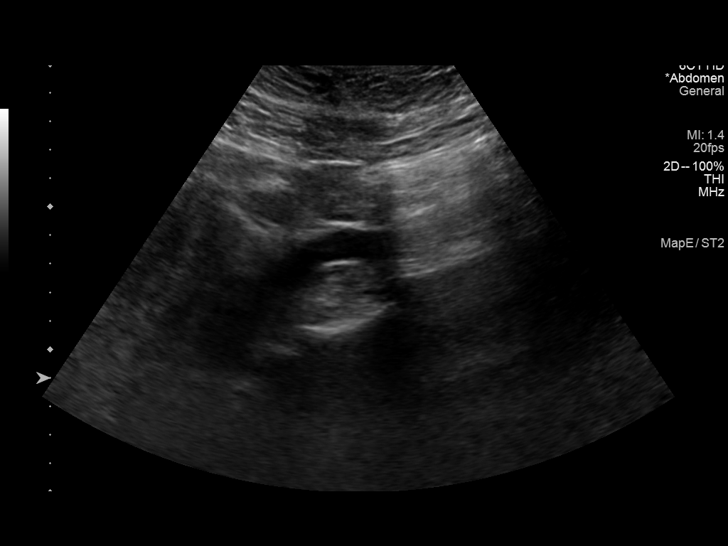
[im 17/18]
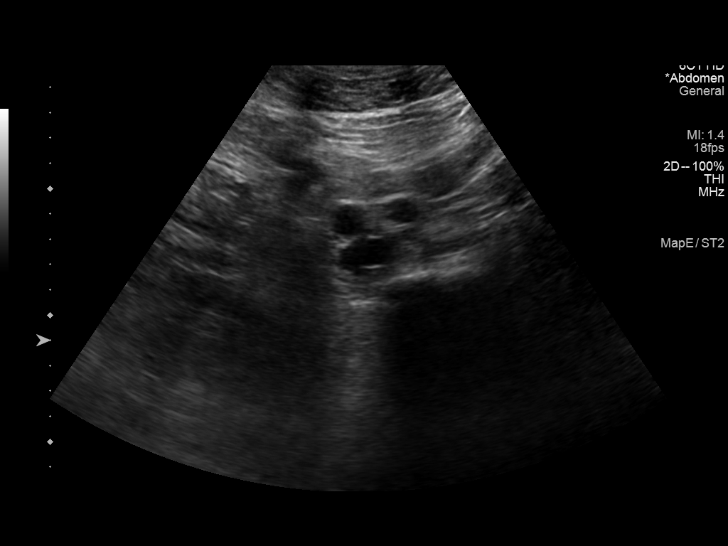
[im 18/18]
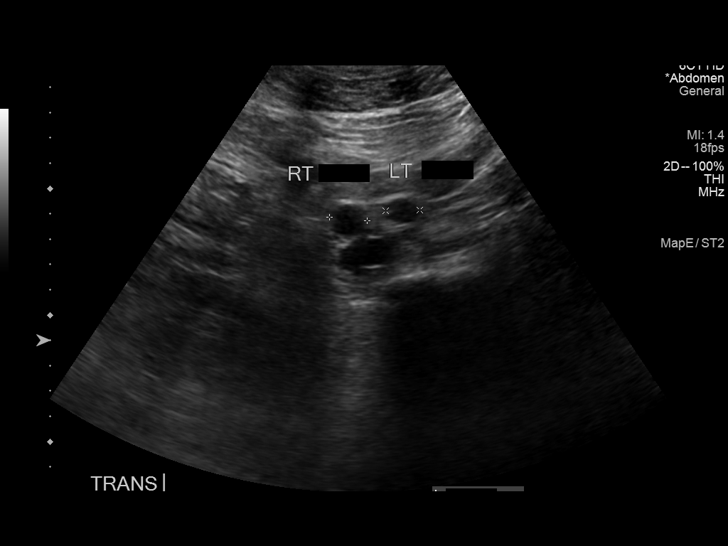

[14 of 18 positions shown; findings below may reference images not displayed]

FINDINGS: Abdominal aortic measurements as follows:

Proximal:  2.1 x 2.2 cm

Mid:  2 x 1.7 cm

Distal:  1.7 x 1.8 cm
IMPRESSION: Negative for abdominal aortic aneurysm.

## 2020-09-30 ENCOUNTER — Other Ambulatory Visit: Payer: Self-pay

## 2020-09-30 ENCOUNTER — Ambulatory Visit (INDEPENDENT_AMBULATORY_CARE_PROVIDER_SITE_OTHER): Payer: PPO | Admitting: Physician Assistant

## 2020-09-30 ENCOUNTER — Encounter: Payer: Self-pay | Admitting: Physician Assistant

## 2020-09-30 VITALS — BP 149/71 | HR 75 | Temp 97.4°F | Ht 64.0 in | Wt 210.3 lb

## 2020-09-30 DIAGNOSIS — I10 Essential (primary) hypertension: Secondary | ICD-10-CM

## 2020-09-30 DIAGNOSIS — L03116 Cellulitis of left lower limb: Secondary | ICD-10-CM | POA: Diagnosis not present

## 2020-09-30 MED ORDER — VALSARTAN-HYDROCHLOROTHIAZIDE 80-12.5 MG PO TABS: 1.0000 | ORAL_TABLET | Freq: Every day | ORAL | 0 refills | Status: DC

## 2020-09-30 NOTE — Patient Instructions (Signed)

## 2020-09-30 NOTE — Progress Notes (Signed)
Established Patient Office Visit  Subjective:  Patient ID: John Franco, male    DOB: 1950/09/03  Age: 70 y.o. MRN: [id number]  CC:  Chief Complaint  Patient presents with  . Cellulitis    HPI John Franco presents for follow up on cellulitis. States ulcers and redness are looking much better. Has a few days left of the antibiotic. No new symptoms.  Elevated blood pressure: Patient denies prior hx of HTN. Denies chest pain, palpitations, shortness of breath, headache or dizziness.   Past Medical History:  Diagnosis Date  . Hypertension     Past Surgical History:  Procedure Laterality Date  . FOOT SURGERY      Family History  Problem Relation Age of Onset  . Arthritis Mother   . Cancer Mother        breast  . Heart attack Father   . Cancer Brother        colon  . Cancer Maternal Grandmother        breast  . Pulmonary embolism Maternal Grandfather   . Cancer Paternal Grandmother        ovarian  . Cancer Paternal Grandfather        prostate    Social History   Socioeconomic History  . Marital status: Married    Spouse name: Not on file  . Number of children: Not on file  . Years of education: Not on file  . Highest education level: Not on file  Occupational History  . Not on file  Tobacco Use  . Smoking status: Former Smoker    Years: 10.00    Types: Pipe    Quit date: 06/19/1978    Years since quitting: 42.3  . Smokeless tobacco: Never Used  Vaping Use  . Vaping Use: Never used  Substance and Sexual Activity  . Alcohol use: Yes    Comment: very seldom  . Drug use: No  . Sexual activity: Not Currently    Birth control/protection: Abstinence  Other Topics Concern  . Not on file  Social History Narrative  . Not on file   Social Determinants of Health   Financial Resource Strain: Not on file  Food Insecurity: Not on file  Transportation Needs: Not on file  Physical Activity: Not on file  Stress: Not on file  Social Connections: Not on file   Intimate Partner Violence: Not on file    Outpatient Medications Prior to Visit  Medication Sig Dispense Refill  . doxycycline (VIBRA-TABS) 100 MG tablet Take 1 tablet (100 mg total) by mouth 2 (two) times daily. 20 tablet 0   No facility-administered medications prior to visit.    Allergies  Allergen Reactions  . Codeine Other (See Comments)    jittery    ROS Review of Systems A fourteen system review of systems was performed and found to be positive as per HPI.   Objective:    Physical Exam General:  Well Developed, well nourished, appropriate for stated age.  Neuro:  Alert and oriented,  extra-ocular muscles intact  HEENT:  Normocephalic, atraumatic, neck supple Skin:  Well healed superficial ulcers with minimal erythema w/o warmth of left lower leg. No drainage present. Cardiac:  RRR, S1 S2 wnl's  Respiratory:  ECTA B/L and A/P, Not using accessory muscles, speaking in full sentences- unlabored. Vascular:  Ext warm, no cyanosis apprec.; cap RF less 2 sec. +edema Psych:  No HI/SI, judgement and insight good, Euthymic mood. Full Affect.   BP (!) 149/71  Pulse 75   Temp (!) 97.4 F (36.3 C)   Ht 5\' 4"  (1.626 m)   Wt 210 lb 4.8 oz (95.4 kg)   SpO2 96%   BMI 36.10 kg/m  Wt Readings from Last 3 Encounters:  09/30/20 210 lb 4.8 oz (95.4 kg)  09/23/20 214 lb 1.6 oz (97.1 kg)  01/06/19 215 lb 4.8 oz (97.7 kg)     Health Maintenance Due  Topic Date Due  . Hepatitis C Screening  Never done  . COVID-19 Vaccine (1) Never done  . TETANUS/TDAP  Never done  . COLONOSCOPY (Pts 45-47yrs Insurance coverage will need to be confirmed)  Never done  . PNA vac Low Risk Adult (1 of 2 - PCV13) Never done    There are no preventive care reminders to display for this patient.  Lab Results  Component Value Date   TSH 1.410 08/29/2018   Lab Results  Component Value Date   WBC 7.3 08/29/2018   HGB 14.4 08/29/2018   HCT 42.2 08/29/2018   MCV 87 08/29/2018   PLT 232  08/29/2018   Lab Results  Component Value Date   NA 137 08/29/2018   K 4.4 08/29/2018   CO2 24 08/29/2018   GLUCOSE 95 08/29/2018   BUN 16 08/29/2018   CREATININE 1.06 08/29/2018   BILITOT 0.5 08/29/2018   ALKPHOS 72 08/29/2018   AST 14 08/29/2018   ALT 14 08/29/2018   PROT 6.4 08/29/2018   ALBUMIN 3.9 08/29/2018   CALCIUM 9.1 08/29/2018   Lab Results  Component Value Date   CHOL 215 (H) 01/06/2019   Lab Results  Component Value Date   HDL 44 01/06/2019   Lab Results  Component Value Date   LDLCALC 148 (H) 01/06/2019   Lab Results  Component Value Date   TRIG 114 01/06/2019   Lab Results  Component Value Date   CHOLHDL 4.9 01/06/2019   Lab Results  Component Value Date   HGBA1C 5.9 (H) 01/06/2019      Assessment & Plan:   Problem List Items Addressed This Visit   None   Visit Diagnoses    Cellulitis of left lower extremity    -  Primary   Hypertension, unspecified type       Relevant Medications   valsartan-hydrochlorothiazide (DIOVAN-HCT) 80-12.5 MG tablet     Cellulitis of left lower extremity: -Significantly improved, recommend to complete antibiotic as directed.  Hypertension, unspecified type: -Patient's blood pressure elevated at last office visit and elevated today. Dicussed starting treatment for hypertension and patient is agreeable to start Valsartan-HCTZ. Discussed potential side effects and advised to let me know if unable to tolerate. -Advised to schedule lab visit next week for FBW including CMP for medication monitoring.  -Follow up in 6 weeks.   Meds ordered this encounter  Medications  . valsartan-hydrochlorothiazide (DIOVAN-HCT) 80-12.5 MG tablet    Sig: Take 1 tablet by mouth daily.    Dispense:  90 tablet    Refill:  0    Order Specific Question:   Supervising Provider    Answer:   Beatrice Lecher D [[id number]]    Follow-up: Return in about 6 weeks (around 11/11/2020) for HTN- started med; lab visit in 1 week for FBW.     Lorrene Reid, PA-C

## 2020-10-07 ENCOUNTER — Other Ambulatory Visit: Payer: PPO

## 2020-10-07 ENCOUNTER — Other Ambulatory Visit: Payer: Self-pay

## 2020-10-07 DIAGNOSIS — Z0000 Encounter for general adult medical examination without abnormal findings: Secondary | ICD-10-CM | POA: Diagnosis not present

## 2020-10-07 DIAGNOSIS — R5383 Other fatigue: Secondary | ICD-10-CM | POA: Diagnosis not present

## 2020-10-07 DIAGNOSIS — R7303 Prediabetes: Secondary | ICD-10-CM

## 2020-10-07 DIAGNOSIS — E7800 Pure hypercholesterolemia, unspecified: Secondary | ICD-10-CM | POA: Diagnosis not present

## 2020-10-07 DIAGNOSIS — I10 Essential (primary) hypertension: Secondary | ICD-10-CM

## 2020-10-08 LAB — COMPREHENSIVE METABOLIC PANEL
ALT: 17 IU/L (ref 0–44)
AST: 20 IU/L (ref 0–40)
Albumin/Globulin Ratio: 1.5 (ref 1.2–2.2)
Albumin: 4.1 g/dL (ref 3.8–4.8)
Alkaline Phosphatase: 80 IU/L (ref 44–121)
BUN/Creatinine Ratio: 18 (ref 10–24)
BUN: 20 mg/dL (ref 8–27)
Bilirubin Total: 0.2 mg/dL (ref 0.0–1.2)
CO2: 24 mmol/L (ref 20–29)
Calcium: 9.5 mg/dL (ref 8.6–10.2)
Chloride: 101 mmol/L (ref 96–106)
Creatinine, Ser: 1.09 mg/dL (ref 0.76–1.27)
Globulin, Total: 2.7 g/dL (ref 1.5–4.5)
Glucose: 96 mg/dL (ref 65–99)
Potassium: 4.4 mmol/L (ref 3.5–5.2)
Sodium: 140 mmol/L (ref 134–144)
Total Protein: 6.8 g/dL (ref 6.0–8.5)
eGFR: 73 mL/min/{1.73_m2} (ref >59)

## 2020-10-08 LAB — CBC
Hematocrit: 46.2 % (ref 37.5–51.0)
Hemoglobin: 15.4 g/dL (ref 13.0–17.7)
MCH: 29.5 pg (ref 26.6–33.0)
MCHC: 33.3 g/dL (ref 31.5–35.7)
MCV: 89 fL (ref 79–97)
Platelets: 263 10*3/uL (ref 150–450)
RBC: 5.22 x10E6/uL (ref 4.14–5.80)
RDW: 12.6 % (ref 11.6–15.4)
WBC: 7.1 10*3/uL (ref 3.4–10.8)

## 2020-10-08 LAB — HEMOGLOBIN A1C
Est. average glucose Bld gHb Est-mCnc: 126 mg/dL
Hgb A1c MFr Bld: 6 % — ABNORMAL HIGH (ref 4.8–5.6)

## 2020-10-08 LAB — LIPID PANEL
Chol/HDL Ratio: 3.9 ratio (ref 0.0–5.0)
Cholesterol, Total: 167 mg/dL (ref 100–199)
HDL: 43 mg/dL (ref >39)
LDL Chol Calc (NIH): 95 mg/dL (ref 0–99)
Triglycerides: 167 mg/dL — ABNORMAL HIGH (ref 0–149)
VLDL Cholesterol Cal: 29 mg/dL (ref 5–40)

## 2020-10-08 LAB — TSH: TSH: 1.47 u[IU]/mL (ref 0.450–4.500)

## 2020-11-12 ENCOUNTER — Ambulatory Visit (INDEPENDENT_AMBULATORY_CARE_PROVIDER_SITE_OTHER): Payer: PPO | Admitting: Physician Assistant

## 2020-11-12 ENCOUNTER — Encounter: Payer: Self-pay | Admitting: Physician Assistant

## 2020-11-12 ENCOUNTER — Other Ambulatory Visit: Payer: Self-pay

## 2020-11-12 VITALS — BP 110/68 | HR 81 | Temp 99.9°F | Ht 66.0 in | Wt 202.8 lb

## 2020-11-12 DIAGNOSIS — D229 Melanocytic nevi, unspecified: Secondary | ICD-10-CM | POA: Diagnosis not present

## 2020-11-12 DIAGNOSIS — I10 Essential (primary) hypertension: Secondary | ICD-10-CM | POA: Diagnosis not present

## 2020-11-12 DIAGNOSIS — R7303 Prediabetes: Secondary | ICD-10-CM | POA: Diagnosis not present

## 2020-11-12 DIAGNOSIS — E7800 Pure hypercholesterolemia, unspecified: Secondary | ICD-10-CM | POA: Diagnosis not present

## 2020-11-12 NOTE — Progress Notes (Signed)
Established Patient Office Visit  Subjective:  Patient ID: John Franco, male    DOB: 26-Jun-1950  Age: 70 y.o. MRN: [id number]  CC:  Chief Complaint  Patient presents with  . Hypertension    HPI John Franco presents for follow up on hypertension. Patient denies chest pain, palpitations, dizziness, headache or increased lower extremity edema. Reports edema is better.Taking medication as directed without issues. Does not check BP at home.  Prediabetes: Patient denies increased thirst or urination. Reports sometimes with binge on things he should not such as sweets.  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 States maybe has grew in size but has had lesion for a long time. No FHx of skin cancer. Denies unintentional weight loss, fever, chills or night sweats.    Past Medical History:  Diagnosis Date  . Hypertension     Past Surgical History:  Procedure Laterality Date  . FOOT SURGERY      Family History  Problem Relation Age of Onset  . Arthritis Mother   . Cancer Mother        breast  . Heart attack Father   . Cancer Brother        colon  . Cancer Maternal Grandmother        breast  . Pulmonary embolism Maternal Grandfather   . Cancer Paternal Grandmother        ovarian  . Cancer Paternal Grandfather        prostate    Social History   Socioeconomic History  . Marital status: Married    Spouse name: Not on file  . Number of children: Not on file  . Years of education: Not on file  . Highest education level: Not on file  Occupational History  . Not on file  Tobacco Use  . Smoking status: Former Smoker    Years: 10.00    Types: Pipe    Quit date: 06/19/1978    Years since quitting: 42.4  . Smokeless tobacco: Never Used  Vaping Use  . Vaping Use: Never used  Substance and Sexual Activity  . Alcohol use: Yes    Comment: very seldom  . Drug use: No  . Sexual activity: Not Currently    Birth control/protection: Abstinence   Other Topics Concern  . Not on file  Social History Narrative  . Not on file   Social Determinants of Health   Financial Resource Strain: Not on file  Food Insecurity: Not on file  Transportation Needs: Not on file  Physical Activity: Not on file  Stress: Not on file  Social Connections: Not on file  Intimate Partner Violence: Not on file    Outpatient Medications Prior to Visit  Medication Sig Dispense Refill  . doxycycline (VIBRA-TABS) 100 MG tablet Take 1 tablet (100 mg total) by mouth 2 (two) times daily. 20 tablet 0  . valsartan-hydrochlorothiazide (DIOVAN-HCT) 80-12.5 MG tablet Take 1 tablet by mouth daily. 90 tablet 0   No facility-administered medications prior to visit.    Allergies  Allergen Reactions  . Codeine Other (See Comments)    jittery    ROS Review of Systems A fourteen system review of systems was performed and found to be positive as per HPI.    Objective:    Physical Exam General:  Well Developed, well nourished, in no acute distress  Neuro:  Alert and oriented,  extra-ocular muscles intact  HEENT:  Normocephalic, atraumatic, neck supple Skin:  Dark pigmented macule  with irregular borders present at right upper forehead Cardiac:  RRR, S1 S2 Respiratory:  ECTA B/L, Not using accessory muscles, speaking in full sentences- unlabored. Vascular:  Ext warm, no cyanosis apprec.; cap RF less 2 sec. Psych:  No HI/SI, judgement and insight good, Euthymic mood. Full Affect.   BP 110/68   Pulse 81   Temp 99.9 F (37.7 C)   Ht _0  (1.676 m)   Wt 202 lb 12.8 oz (92 kg)   SpO2 97%   BMI 32.73 kg/m  Wt Readings from Last 3 Encounters:  11/12/20 202 lb 12.8 oz (92 kg)  09/30/20 210 lb 4.8 oz (95.4 kg)  09/23/20 214 lb 1.6 oz (97.1 kg)     Health Maintenance Due  Topic Date Due  . COVID-19 Vaccine (1) Never done  . Hepatitis C Screening  Never done  . TETANUS/TDAP  Never done  . COLONOSCOPY (Pts 45-39yr Insurance coverage will need to be  confirmed)  Never done  . Zoster Vaccines- Shingrix (1 of 2) Never done  . PNA vac Low Risk Adult (1 of 2 - PCV13) Never done    There are no preventive care reminders to display for this patient.  Lab Results  Component Value Date   TSH 1.470 10/07/2020   Lab Results  Component Value Date   WBC 7.1 10/07/2020   HGB 15.4 10/07/2020   HCT 46.2 10/07/2020   MCV 89 10/07/2020   PLT 263 10/07/2020   Lab Results  Component Value Date   NA 140 10/07/2020   K 4.4 10/07/2020   CO2 24 10/07/2020   GLUCOSE 96 10/07/2020   BUN 20 10/07/2020   CREATININE 1.09 10/07/2020   BILITOT 0.2 10/07/2020   ALKPHOS 80 10/07/2020   AST 20 10/07/2020   ALT 17 10/07/2020   PROT 6.8 10/07/2020   ALBUMIN 4.1 10/07/2020   CALCIUM 9.5 10/07/2020   EGFR 73 10/07/2020   Lab Results  Component Value Date   CHOL 167 10/07/2020   Lab Results  Component Value Date   HDL 43 10/07/2020   Lab Results  Component Value Date   LDLCALC 95 10/07/2020   Lab Results  Component Value Date   TRIG 167 (H) 10/07/2020   Lab Results  Component Value Date   CHOLHDL 3.9 10/07/2020   Lab Results  Component Value Date   HGBA1C 6.0 (H) 10/07/2020      Assessment & Plan:   Problem List Items Addressed This Visit      Other   Pre-diabetes - Primary   Elevated LDL cholesterol level    Other Visit Diagnoses    Hypertension, unspecified type       Atypical mole         Hypertension: -Improved and at goal. -Continue current medication regimen. Recent CMP normal. -Follow a low sodium diet and stay well hydrated. -Will continue to monitor.  Prediabetes: -Discussed recent A1c 6.0, stable. -Recommend to monitor/reduce carbohydrates and glucose.  -Will continue to monitor.  Elevated LDL cholesterol level: -Discussed recent lipid panel: total cholesterol 167, triglycerides 167, HDL 43, LDL 95 -Recommend to reduce simple carbohydrates and follow a heart healthy diet. -Recommend to increase  physical activity. -Will continue to monitor.  Atypical mole: -Discussed with patient referral to dermatology and plans to monitor for changes at this time. Highly encouraged to reconsider referral if lesion evolves or changes. Discussed ABCDE guidelines. Patient verbalized understanding. -Recommend to use sunscreen daily.    No orders of the defined  types were placed in this encounter.   Follow-up: Return in about 6 months (around 05/15/2021) for HTN.   Note:  This note was prepared with assistance of Dragon voice recognition software. Occasional wrong-word or sound-a-like substitutions may have occurred due to the inherent limitations of voice recognition software.  Lorrene Reid, PA-C

## 2020-11-12 NOTE — Patient Instructions (Signed)
How to Take Your Blood Pressure Blood pressure measures how strongly your blood is pressing against the walls of your arteries. Arteries are blood vessels that carry blood from your heart throughout your body. You can take your blood pressure at home with a machine. You may need to check your blood pressure at home:  To check if you have high blood pressure (hypertension).  To check your blood pressure over time.  To make sure your blood pressure medicine is working. Supplies needed:  Blood pressure machine, or monitor.  Dining room chair to sit in.  Table or desk.  Small notebook.  Pencil or pen. How to prepare Avoid these things for 30 minutes before checking your blood pressure:  Having drinks with caffeine in them, such as coffee or tea.  Drinking alcohol.  Eating.  Smoking.  Exercising. Do these things five minutes before checking your blood pressure:  Go to the bathroom and pee (urinate).  Sit in a dining chair. Do not sit in a soft couch or an armchair.  Be quiet. Do not talk. How to take your blood pressure Follow the instructions that came with your machine. If you have a digital blood pressure monitor, these may be the instructions: 1. Sit up straight. 2. Place your feet on the floor. Do not cross your ankles or legs. 3. Rest your left arm at the level of your heart. You may rest it on a table, desk, or chair. 4. Pull up your shirt sleeve. 5. Wrap the blood pressure cuff around the upper part of your left arm. The cuff should be 1 inch (2.5 cm) above your elbow. It is best to wrap the cuff around bare skin. 6. Fit the cuff snugly around your arm. You should be able to place only one finger between the cuff and your arm. 7. Place the cord so that it rests in the bend of your elbow. 8. Press the power button. 9. Sit quietly while the cuff fills with air and loses air. 10. Write down the numbers on the screen. 11. Wait 2-3 minutes and then repeat steps 1-10.    What do the numbers mean? Two numbers make up your blood pressure. The first number is called systolic pressure. The second is called diastolic pressure. An example of a blood pressure reading is "120 over 80" (or 120/80). If you are an adult and do not have a medical condition, use this guide to find out if your blood pressure is normal: Normal  First number: below 120.  Second number: below 80. Elevated  First number: 120-129.  Second number: below 80. Hypertension stage 1  First number: 130-139.  Second number: 80-89. Hypertension stage 2  First number: 140 or above.  Second number: 90 or above. Your blood pressure is above normal even if only the top or bottom number is above normal. Follow these instructions at home:  Check your blood pressure as often as your doctor tells you to.  Check your blood pressure at the same time every day.  Take your monitor to your next doctor's appointment. Your doctor will: ? Make sure you are using it correctly. ? Make sure it is working right.  Make sure you understand what your blood pressure numbers should be.  Tell your doctor if your medicine is causing side effects.  Keep all follow-up visits as told by your doctor. This is important. General tips:  You will need a blood pressure machine, or monitor. Your doctor can suggest a   monitor. You can buy one at a drugstore or online. When choosing one: ? Choose one with an arm cuff. ? Choose one that wraps around your upper arm. Only one finger should fit between your arm and the cuff. ? Do not choose one that measures your blood pressure from your wrist or finger. Where to find more information American [employer] Association: www.heart.org Contact a doctor if:  Your blood pressure keeps being high. Get help right away if:  Your first blood pressure number is higher than 180.  Your second blood pressure number is higher than 120. Summary  Check your blood pressure at the same  time every day.  Avoid caffeine, alcohol, smoking, and exercise for 30 minutes before checking your blood pressure.  Make sure you understand what your blood pressure numbers should be. This information is not intended to replace advice given to you by your health care provider. Make sure you discuss any questions you have with your health care provider. Document Revised: 05/30/2019 Document Reviewed: 05/30/2019 Elsevier Patient Education  2021 [employer]  

## 2021-01-02 ENCOUNTER — Other Ambulatory Visit: Payer: Self-pay | Admitting: Physician Assistant

## 2021-01-02 DIAGNOSIS — I10 Essential (primary) hypertension: Secondary | ICD-10-CM

## 2021-03-13 ENCOUNTER — Ambulatory Visit (INDEPENDENT_AMBULATORY_CARE_PROVIDER_SITE_OTHER): Payer: PPO

## 2021-03-13 DIAGNOSIS — Z0000 Encounter for general adult medical examination without abnormal findings: Secondary | ICD-10-CM | POA: Diagnosis not present

## 2021-03-13 NOTE — Patient Instructions (Signed)
Colonoscopy, Adult A colonoscopy is a procedure to look at the entire large intestine. This procedure is done using a long, thin, flexible tube that has a camera on the end. You may have a colonoscopy: As a part of normal colorectal screening. If you have certain symptoms, such as: A low number of red blood cells in your blood (anemia). Diarrhea that does not go away. Pain in your abdomen. Blood in your stool. A colonoscopy can help screen for and diagnose medical problems, including: Tumors. Extra tissue that grows where mucus forms (polyps). Inflammation. Areas of bleeding. Tell your health care provider about: Any allergies you have. All medicines you are taking, including vitamins, herbs, eye drops, creams, and over-the-counter medicines. Any problems you or family members have had with anesthetic medicines. Any blood disorders you have. Any surgeries you have had. Any medical conditions you have. Any problems you have had with having bowel movements. Whether you are pregnant or may be pregnant. What are the risks? Generally, this is a safe procedure. However, problems may occur, including: Bleeding. Damage to your intestine. Allergic reactions to medicines given during the procedure. Infection. This is rare. What happens before the procedure? Eating and drinking restrictions Follow instructions from your health care provider about eating or drinking restrictions, which may include: A few days before the procedure: Follow a low-fiber diet. Avoid nuts, seeds, dried fruit, raw fruits, and vegetables. 1-3 days before the procedure: Eat only gelatin dessert or ice pops. Drink only clear liquids, such as water, clear juice, clear broth or bouillon, black coffee or tea, or clear soft drinks or sports drinks. Avoid liquids that contain red or purple dye. The day of the procedure: Do not eat solid foods. You may continue to drink clear liquids until up to 2 hours before the  procedure. Do not eat or drink anything starting 2 hours before the procedure, or within the time period that your health care provider recommends. Bowel prep If you were prescribed a bowel prep to take by mouth (orally) to clean out your colon: Take it as told by your health care provider. Starting the day before your procedure, you will need to drink a large amount of liquid medicine. The liquid will cause you to have many bowel movements of loose stool until your stool becomes almost clear or light green. If your skin or the opening between the buttocks (anus) gets irritated from diarrhea, you may relieve the irritation using: Wipes with medicine in them, such as adult wet wipes with aloe and vitamin E. A product to soothe skin, such as petroleum jelly. If you vomit while drinking the bowel prep: Take a break for up to 60 minutes. Begin the bowel prep again. Call your health care provider if you keep vomiting or you cannot take the bowel prep without vomiting. To clean out your colon, you may also be given: Laxative medicines. These help you have a bowel movement. Instructions for enema use. An enema is liquid medicine injected into your rectum. Medicines Ask your health care provider about: Changing or stopping your regular medicines or supplements. This is especially important if you are taking iron supplements, diabetes medicines, or blood thinners. Taking medicines such as aspirin and ibuprofen. These medicines can thin your blood. Do not take these medicines unless your health care provider tells you to take them. Taking over-the-counter medicines, vitamins, herbs, and supplements. General instructions Ask your health care provider what steps will be taken to help prevent infection. These may include  washing skin with a germ-killing soap. Plan to have someone take you home from the hospital or clinic. What happens during the procedure?  An IV will be inserted into one of your  veins. You may be given one or more of the following: A medicine to help you relax (sedative). A medicine to numb the area (local anesthetic). A medicine to make you fall asleep (general anesthetic). This is rarely needed. You will lie on your side with your knees bent. The tube will: Have oil or gel put on it (be lubricated). Be inserted into your anus. Be gently eased through all parts of your large intestine. Air will be sent into your colon to keep it open. This may cause some pressure or cramping. Images will be taken with the camera and will appear on a screen. A small tissue sample may be removed to be looked at under a microscope (biopsy). The tissue may be sent to a lab for testing if any signs of problems are found. If small polyps are found, they may be removed and checked for cancer cells. When the procedure is finished, the tube will be removed. The procedure may vary among health care providers and hospitals. What happens after the procedure? Your blood pressure, heart rate, breathing rate, and blood oxygen level will be monitored until you leave the hospital or clinic. You may have a small amount of blood in your stool. You may pass gas and have mild cramping or bloating in your abdomen. This is caused by the air that was used to open your colon during the exam. Do not drive for 24 hours after the procedure. It is up to you to get the results of your procedure. Ask your health care provider, or the department that is doing the procedure, when your results will be ready. Summary A colonoscopy is a procedure to look at the entire large intestine. Follow instructions from your health care provider about eating and drinking before the procedure. If you were prescribed an oral bowel prep to clean out your colon, take it as told by your health care provider. During the colonoscopy, a flexible tube with a camera on its end is inserted into the anus and then passed into the other  parts of the large intestine. This information is not intended to replace advice given to you by your health care provider. Make sure you discuss any questions you have with your health care provider. Document Revised: 12/27/2018 Document Reviewed: 12/27/2018 Elsevier Patient Education  [place] Prevention in the Home, Adult Falls can cause injuries and can happen to people of all ages. There are many things you can do to make your home safe and to help prevent falls. Ask for help when making these changes. What actions can I take to prevent falls? General Instructions Use good lighting in all rooms. Replace any light bulbs that burn out. Turn on the lights in dark areas. Use night-lights. Keep items that you use often in easy-to-reach places. Lower the shelves around your home if needed. Set up your furniture so you have a clear path. Avoid moving your furniture around. Do not have throw rugs or other things on the floor that can make you trip. Avoid walking on wet floors. If any of your floors are uneven, fix them. Add color or contrast paint or tape to clearly mark and help you see: Grab bars or handrails. First and last steps of staircases. Where the edge of each step  is. If you use a stepladder: Make sure that it is fully opened. Do not climb a closed stepladder. Make sure the sides of the stepladder are locked in place. Ask someone to hold the stepladder while you use it. Know where your pets are when moving through your home. What can I do in the bathroom?   Keep the floor dry. Clean up any water on the floor right away. Remove soap buildup in the tub or shower. Use nonskid mats or decals on the floor of the tub or shower. Attach bath mats securely with double-sided, nonslip rug tape. If you need to sit down in the shower, use a plastic, nonslip stool. Install grab bars by the toilet and in the tub and shower. Do not use towel bars as grab bars. What can I do  in the bedroom? Make sure that you have a light by your bed that is easy to reach. Do not use any sheets or blankets for your bed that hang to the floor. Have a firm chair with side arms that you can use for support when you get dressed. What can I do in the kitchen? Clean up any spills right away. If you need to reach something above you, use a step stool with a grab bar. Keep electrical cords out of the way. Do not use floor polish or wax that makes floors slippery. What can I do with my stairs? Do not leave any items on the stairs. Make sure that you have a light switch at the top and the bottom of the stairs. Make sure that there are handrails on both sides of the stairs. Fix handrails that are broken or loose. Install nonslip stair treads on all your stairs. Avoid having throw rugs at the top or bottom of the stairs. Choose a carpet that does not hide the edge of the steps on the stairs. Check carpeting to make sure that it is firmly attached to the stairs. Fix carpet that is loose or worn. What can I do on the outside of my home? Use bright outdoor lighting. Fix the edges of walkways and driveways and fix any cracks. Remove anything that might make you trip as you walk through a door, such as a raised step or threshold. Trim any bushes or trees on paths to your home. Check to see if handrails are loose or broken and that both sides of all steps have handrails. Install guardrails along the edges of any raised decks and porches. Clear paths of anything that can make you trip, such as tools or rocks. Have leaves, snow, or ice cleared regularly. Use sand or salt on paths during winter. Clean up any spills in your garage right away. This includes grease or oil spills. What other actions can I take? Wear shoes that: Have a low heel. Do not wear high heels. Have rubber bottoms. Feel good on your feet and fit well. Are closed at the toe. Do not wear open-toe sandals. Use tools that  help you move around if needed. These include: Canes. Walkers. Scooters. Crutches. Review your medicines with your doctor. Some medicines can make you feel dizzy. This can increase your chance of falling. Ask your doctor what else you can do to help prevent falls. Where to find more information Centers for Disease Control and Prevention, [place]: http://www.wolf.info/ National Institute on Aging: http://kim-miller.com/ Contact a doctor if: You are afraid of falling at home. You feel weak, drowsy, or dizzy at home. You fall at home.  Summary There are many simple things that you can do to make your home safe and to help prevent falls. Ways to make your home safe include removing things that can make you trip and installing grab bars in the bathroom. Ask for help when making these changes in your home. This information is not intended to replace advice given to you by your health care provider. Make sure you discuss any questions you have with your health care provider. Document Revised: 01/07/2020 Document Reviewed: 01/07/2020 Elsevier Patient Education  [place] Maintenance, Male Adopting a healthy lifestyle and getting preventive care are important in promoting health and wellness. Ask your health care provider about: The right schedule for you to have regular tests and exams. Things you can do on your own to prevent diseases and keep yourself healthy. What should I know about diet, weight, and exercise? Eat a healthy diet  Eat a diet that includes plenty of vegetables, fruits, low-fat dairy products, and lean protein. Do not eat a lot of foods that are high in solid fats, added sugars, or sodium. Maintain a healthy weight Body mass index (BMI) is a measurement that can be used to identify possible weight problems. It estimates body fat based on height and weight. Your health care provider can help determine your BMI and help you achieve or maintain a healthy weight. Get regular  exercise Get regular exercise. This is one of the most important things you can do for your health. Most adults should: Exercise for at least 150 minutes each week. The exercise should increase your heart rate and make you sweat (moderate-intensity exercise). Do strengthening exercises at least twice a week. This is in addition to the moderate-intensity exercise. Spend less time sitting. Even light physical activity can be beneficial. Watch cholesterol and blood lipids Have your blood tested for lipids and cholesterol at 70 years of age, then have this test every 5 years. You may need to have your cholesterol levels checked more often if: Your lipid or cholesterol levels are high. You are older than 70 years of age. You are at high risk for heart disease. What should I know about cancer screening? Many types of cancers can be detected early and may often be prevented. Depending on your health history and family history, you may need to have cancer screening at various ages. This may include screening for: Colorectal cancer. Prostate cancer. Skin cancer. Lung cancer. What should I know about heart disease, diabetes, and high blood pressure? Blood pressure and heart disease High blood pressure causes heart disease and increases the risk of stroke. This is more likely to develop in people who have high blood pressure readings, are of African descent, or are overweight. Talk with your health care provider about your target blood pressure readings. Have your blood pressure checked: Every 3-5 years if you are 54-94 years of age. Every year if you are 81 years old or older. If you are between the ages of 51 and 62 and are a current or former smoker, ask your health care provider if you should have a one-time screening for abdominal aortic aneurysm (AAA). Diabetes Have regular diabetes screenings. This checks your fasting blood sugar level. Have the screening done: Once every three years after age  73 if you are at a normal weight and have a low risk for diabetes. More often and at a younger age if you are overweight or have a high risk for diabetes. What should I know  about preventing infection? Hepatitis B If you have a higher risk for hepatitis B, you should be screened for this virus. Talk with your health care provider to find out if you are at risk for hepatitis B infection. Hepatitis C Blood testing is recommended for: Everyone born from [birth year] through 1965. Anyone with known risk factors for hepatitis C. Sexually transmitted infections (STIs) You should be screened each year for STIs, including gonorrhea and chlamydia, if: You are sexually active and are younger than 70 years of age. You are older than 70 years of age and your health care provider tells you that you are at risk for this type of infection. Your sexual activity has changed since you were last screened, and you are at increased risk for chlamydia or gonorrhea. Ask your health care provider if you are at risk. Ask your health care provider about whether you are at high risk for HIV. Your health care provider may recommend a prescription medicine to help prevent HIV infection. If you choose to take medicine to prevent HIV, you should first get tested for HIV. You should then be tested every 3 months for as long as you are taking the medicine. Follow these instructions at home: Lifestyle Do not use any products that contain nicotine or tobacco, such as cigarettes, e-cigarettes, and chewing tobacco. If you need help quitting, ask your health care provider. Do not use street drugs. Do not share needles. Ask your health care provider for help if you need support or information about quitting drugs. Alcohol use Do not drink alcohol if your health care provider tells you not to drink. If you drink alcohol: Limit how much you have to 0-2 drinks a day. Be aware of how much alcohol is in your drink. In the [place] one drink  equals one 12 oz bottle of beer (355 mL), one 5 oz glass of wine (148 mL), or one 1 oz glass of hard liquor (44 mL). General instructions Schedule regular health, dental, and eye exams. Stay current with your vaccines. Tell your health care provider if: You often feel depressed. You have ever been abused or do not feel safe at home. Summary Adopting a healthy lifestyle and getting preventive care are important in promoting health and wellness. Follow your health care provider's instructions about healthy diet, exercising, and getting tested or screened for diseases. Follow your health care provider's instructions on monitoring your cholesterol and blood pressure. This information is not intended to replace advice given to you by your health care provider. Make sure you discuss any questions you have with your health care provider. Document Revised: 08/13/2020 Document Reviewed: 05/29/2018 Elsevier Patient Education  2022 [employer].

## 2021-03-13 NOTE — Progress Notes (Signed)
I connected with  John Franco on 03/13/21 by a phone enabled telemedicine application and verified that I am speaking with the correct person using two identifiers.   I discussed the limitations of evaluation and management by telemedicine. The patient expressed understanding and agreed to proceed.  Subjective:   John Franco is a 70 y.o. male who presents for Medicare Annual/Subsequent preventive examination.  Review of Systems    Defer to PCP  Cardiac Risk Factors include: advanced age (>26men, >51 women);hypertension     Objective:    There were no vitals filed for this visit. There is no height or weight on file to calculate BMI.  Advanced Directives 07/31/2018  Does Patient Have a Medical Advance Directive? No  Would patient like information on creating a medical advance directive? No - Patient declined    Current Medications (verified) Outpatient Encounter Medications as of 03/13/2021  Medication Sig   doxycycline (VIBRA-TABS) 100 MG tablet Take 1 tablet (100 mg total) by mouth 2 (two) times daily.   valsartan-hydrochlorothiazide (DIOVAN-HCT) 80-12.5 MG tablet TAKE 1 TABLET BY MOUTH EVERY DAY   No facility-administered encounter medications on file as of 03/13/2021.    Allergies (verified) Codeine   History: Past Medical History:  Diagnosis Date   Hypertension    Past Surgical History:  Procedure Laterality Date   FOOT SURGERY     Family History  Problem Relation Age of Onset   Arthritis Mother    Cancer Mother        breast   Heart attack Father    Cancer Brother        colon   Cancer Maternal Grandmother        breast   Pulmonary embolism Maternal Grandfather    Cancer Paternal Grandmother        ovarian   Cancer Paternal Grandfather        prostate   Social History   Socioeconomic History   Marital status: Married    Spouse name: Not on file   Number of children: Not on file   Years of education: Not on file   Highest education level: Not on  file  Occupational History   Not on file  Tobacco Use   Smoking status: Former    Types: Pipe    Quit date: 06/19/1978    Years since quitting: 42.7   Smokeless tobacco: Never  Vaping Use   Vaping Use: Never used  Substance and Sexual Activity   Alcohol use: Yes    Comment: very seldom   Drug use: No   Sexual activity: Not Currently    Birth control/protection: Abstinence  Other Topics Concern   Not on file  Social History Narrative   Not on file   Social Determinants of Health   Financial Resource Strain: Low Risk    Difficulty of Paying Living Expenses: Not very hard  Food Insecurity: No Food Insecurity   Worried About Running Out of Food in the Last Year: Never true   [place] in the Last Year: Never true  Transportation Needs: No Transportation Needs   Lack of Transportation (Medical): No   Lack of Transportation (Non-Medical): No  Physical Activity: Inactive   Days of Exercise per Week: 0 days   Minutes of Exercise per Session: 0 min  Stress: No Stress Concern Present   Feeling of Stress : Not at all  Social Connections: Unknown   Frequency of Communication with Friends and Family: More than three times a  week   Frequency of Social Gatherings with Friends and Family: More than three times a week   Attends Religious Services: 1 to 4 times per year   Active Member of [employer] or Organizations: Yes   Attends Archivist Meetings: 1 to 4 times per year   Marital Status: Not on file    Tobacco Counseling Counseling given: Not Answered   Clinical Intake:           Diabetes: No  How often do you need to have someone help you when you read instructions, pamphlets, or other written materials from your doctor or pharmacy?: 1 - Never  Diabetic?No  Interpreter Needed?: No      Activities of Daily Living In your present state of health, do you have any difficulty performing the following activities: 03/13/2021 11/12/2020  Hearing? N N  Vision?  N N  Difficulty concentrating or making decisions? Y N  Walking or climbing stairs? Y N  Dressing or bathing? N N  Doing errands, shopping? N N  Preparing Food and eating ? N -  Using the Toilet? N -  In the past six months, have you accidently leaked urine? Y -  Do you have problems with loss of bowel control? Y -  Managing your Medications? N -  Managing your Finances? N -  Housekeeping or managing your Housekeeping? N -  Some recent data might be hidden    Patient Care Team: Lorrene Reid, PA-C as PCP - General (Physician Assistant)  Indicate any recent Medical Services you may have received from other than [facility] providers in the past year (date may be approximate).     Assessment:   This is a routine wellness examination for John Franco.  Hearing/Vision screen Hearing Screening - Comments:: Passed whisper test  Vision Screening - Comments:: Wears reading glasses and has no trouble with seeing.   Dietary issues and exercise activities discussed: Current Exercise Habits: The patient does not participate in regular exercise at present, Time (Minutes): 10, Frequency (Times/Week): 1, Weekly Exercise (Minutes/Week): 10, Intensity: Mild, Exercise limited by: orthopedic condition(s)   Goals Addressed   None   Depression Screen PHQ 2/9 Scores 03/13/2021 11/12/2020 09/30/2020 09/23/2020 01/06/2019 09/02/2018 07/31/2018  PHQ - 2 Score 0 0 0 0 1 0 0  PHQ- 9 Score - 2 0 0 7 4 3     Fall Risk Fall Risk  03/13/2021 11/12/2020 09/30/2020 09/23/2020 07/31/2018  Falls in the past year? 0 0 0 0 1  Number falls in past yr: 0 0 - - 0  Injury with Fall? 0 0 - - 1  Risk for fall due to : Orthopedic patient - - - -  Follow up Falls evaluation completed Falls evaluation completed Falls evaluation completed Falls evaluation completed -    FALL RISK PREVENTION PERTAINING TO THE HOME:  Any stairs in or around the home? Yes  If so, are there any without handrails? Yes  Home free of loose throw rugs in  walkways, pet beds, electrical cords, etc? Yes  Adequate lighting in your home to reduce risk of falls? Yes   ASSISTIVE DEVICES UTILIZED TO PREVENT FALLS:  Life alert? No  Use of a cane, walker or w/c? No  Grab bars in the bathroom? No  Shower chair or bench in shower? No  Elevated toilet seat or a handicapped toilet? No   TIMED UP AND GO:  Was the test performed? No .  Length of time to ambulate 10 feet: 0 sec.  Gait slow and steady without use of assistive device  Cognitive Function:     6CIT Screen 03/13/2021  What Year? 0 points  What month? 0 points  What time? 0 points  Count back from 20 0 points  Months in reverse 0 points  Repeat phrase 0 points  Total Score 0    Immunizations Immunization History  Administered Date(s) Administered   Influenza, High Dose Seasonal PF 09/02/2018    TDAP status: Due, Education has been provided regarding the importance of this vaccine. Advised may receive this vaccine at local pharmacy or Health Dept. Aware to provide a copy of the vaccination record if obtained from local pharmacy or Health Dept. Verbalized acceptance and understanding.  Flu Vaccine status: Due, Education has been provided regarding the importance of this vaccine. Advised may receive this vaccine at local pharmacy or Health Dept. Aware to provide a copy of the vaccination record if obtained from local pharmacy or Health Dept. Verbalized acceptance and understanding.  Pneumococcal vaccine status: Due, Education has been provided regarding the importance of this vaccine. Advised may receive this vaccine at local pharmacy or Health Dept. Aware to provide a copy of the vaccination record if obtained from local pharmacy or Health Dept. Verbalized acceptance and understanding.  Covid-19 vaccine status: Information provided on how to obtain vaccines.   Qualifies for Shingles Vaccine? Yes   Zostavax completed No   Shingrix Completed?: No.    Education has been provided  regarding the importance of this vaccine. Patient has been advised to call insurance company to determine out of pocket expense if they have not yet received this vaccine. Advised may also receive vaccine at local pharmacy or Health Dept. Verbalized acceptance and understanding.  Screening Tests Health Maintenance  Topic Date Due   COVID-19 Vaccine (1) Never done   Hepatitis C Screening  Never done   TETANUS/TDAP  Never done   COLONOSCOPY (Pts 45-61yrs Insurance coverage will need to be confirmed)  Never done   Zoster Vaccines- Shingrix (1 of 2) Never done   INFLUENZA VACCINE  01/17/2021   HPV VACCINES  Aged Out    Health Maintenance  Colon Cancer Screening: Needs colonoscopy-Defer to PCP.  Lung Cancer Screening: (Low Dose CT Chest recommended if Age 25-80 years, 30 pack-year currently smoking OR have quit w/in 15years.) does qualify.   Lung Cancer Screening Referral: Defer to PCP.  Additional Screening:  Hepatitis C Screening: does qualify; Defer to PCP Vision Screening: Recommended annual ophthalmology exams for early detection of glaucoma and other disorders of the eye. Is the patient up to date with their annual eye exam?  No  Who is the provider or what is the name of the office in which the patient attends annual eye exams? Needs Established If pt is not established with a provider, would they like to be referred to a provider to establish care? Yes .   Dental Screening: Recommended annual dental exams for proper oral hygiene  Community Resource Referral / Chronic Care Management: CRR required this visit?  No   CCM required this visit?  No      Plan:     I have personally reviewed and noted the following in the patient's chart:   Medical and social history Use of alcohol, tobacco or illicit drugs  Current medications and supplements including opioid prescriptions. Patient is not currently taking opioid prescriptions. Functional ability and status Nutritional  status Physical activity Advanced directives List of other physicians Hospitalizations, surgeries, and ER visits  in previous 12 months Vitals Screenings to include cognitive, depression, and falls Referrals and appointments  In addition, I have reviewed and discussed with patient certain preventive protocols, quality metrics, and best practice recommendations. A written personalized care plan for preventive services as well as general preventive health recommendations were provided to patient.     Stephan Minister, [place]   03/13/2021

## 2021-04-05 ENCOUNTER — Other Ambulatory Visit: Payer: Self-pay | Admitting: Physician Assistant

## 2021-04-05 DIAGNOSIS — I10 Essential (primary) hypertension: Secondary | ICD-10-CM

## 2021-05-23 ENCOUNTER — Other Ambulatory Visit: Payer: Self-pay

## 2021-05-23 ENCOUNTER — Ambulatory Visit: Payer: PPO | Admitting: Physician Assistant

## 2021-05-30 ENCOUNTER — Encounter: Payer: Self-pay | Admitting: Physician Assistant

## 2021-05-30 ENCOUNTER — Ambulatory Visit (INDEPENDENT_AMBULATORY_CARE_PROVIDER_SITE_OTHER): Payer: PPO | Admitting: Physician Assistant

## 2021-05-30 ENCOUNTER — Other Ambulatory Visit: Payer: Self-pay

## 2021-05-30 VITALS — BP 111/68 | HR 78 | Temp 98.2°F | Ht 66.0 in | Wt 206.0 lb

## 2021-05-30 DIAGNOSIS — R7303 Prediabetes: Secondary | ICD-10-CM | POA: Diagnosis not present

## 2021-05-30 DIAGNOSIS — I10 Essential (primary) hypertension: Secondary | ICD-10-CM

## 2021-05-30 MED ORDER — VALSARTAN-HYDROCHLOROTHIAZIDE 80-12.5 MG PO TABS: 1.0000 | ORAL_TABLET | Freq: Every day | ORAL | 1 refills | Status: DC

## 2021-05-30 NOTE — Patient Instructions (Signed)
DASH Eating Plan DASH stands for Dietary Approaches to Stop Hypertension. The DASH eating plan is a healthy eating plan that has been shown to: Reduce high blood pressure (hypertension). Reduce your risk for type 2 diabetes, heart disease, and stroke. Help with weight loss. What are tips for following this plan? Reading food labels Check food labels for the amount of salt (sodium) per serving. Choose foods with less than 5 percent of the Daily Value of sodium. Generally, foods with less than 300 milligrams (mg) of sodium per serving fit into this eating plan. To find whole grains, look for the word "whole" as the first word in the ingredient list. Shopping Buy products labeled as "low-sodium" or "no salt added." Buy fresh foods. Avoid canned foods and pre-made or frozen meals. Cooking Avoid adding salt when cooking. Use salt-free seasonings or herbs instead of table salt or sea salt. Check with your health care provider or pharmacist before using salt substitutes. Do not fry foods. Cook foods using healthy methods such as baking, boiling, grilling, roasting, and broiling instead. Cook with heart-healthy oils, such as olive, canola, avocado, soybean, or sunflower oil. Meal planning  Eat a balanced diet that includes: 4 or more servings of fruits and 4 or more servings of vegetables each day. Try to fill one-half of your plate with fruits and vegetables. 6-8 servings of whole grains each day. Less than 6 oz (170 g) of lean meat, poultry, or fish each day. A 3-oz (85-g) serving of meat is about the same size as a deck of cards. One egg equals 1 oz (28 g). 2-3 servings of low-fat dairy each day. One serving is 1 cup (237 mL). 1 serving of nuts, seeds, or beans 5 times each week. 2-3 servings of heart-healthy fats. Healthy fats called omega-3 fatty acids are found in foods such as walnuts, flaxseeds, fortified milks, and eggs. These fats are also found in cold-water fish, such as sardines, salmon,  and mackerel. Limit how much you eat of: Canned or prepackaged foods. Food that is high in trans fat, such as some fried foods. Food that is high in saturated fat, such as fatty meat. Desserts and other sweets, sugary drinks, and other foods with added sugar. Full-fat dairy products. Do not salt foods before eating. Do not eat more than 4 egg yolks a week. Try to eat at least 2 vegetarian meals a week. Eat more home-cooked food and less restaurant, buffet, and fast food. Lifestyle When eating at a restaurant, ask that your food be prepared with less salt or no salt, if possible. If you drink alcohol: Limit how much you use to: 0-1 drink a day for women who are not pregnant. 0-2 drinks a day for men. Be aware of how much alcohol is in your drink. In the [place] one drink equals one 12 oz bottle of beer (355 mL), one 5 oz glass of wine (148 mL), or one 1 oz glass of hard liquor (44 mL). General information Avoid eating more than 2,300 mg of salt a day. If you have hypertension, you may need to reduce your sodium intake to 1,500 mg a day. Work with your health care provider to maintain a healthy body weight or to lose weight. Ask what an ideal weight is for you. Get at least 30 minutes of exercise that causes your heart to beat faster (aerobic exercise) most days of the week. Activities may include walking, swimming, or biking. Work with your health care provider or dietitian to   adjust your eating plan to your individual calorie needs. What foods should I eat? Fruits All fresh, dried, or frozen fruit. Canned fruit in natural juice (without added sugar). Vegetables Fresh or frozen vegetables (raw, steamed, roasted, or grilled). Low-sodium or reduced-sodium tomato and vegetable juice. Low-sodium or reduced-sodium tomato sauce and tomato paste. Low-sodium or reduced-sodium canned vegetables. Grains Whole-grain or whole-wheat bread. Whole-grain or whole-wheat pasta. Brown rice. Oatmeal. Quinoa.  Bulgur. Whole-grain and low-sodium cereals. Pita bread. Low-fat, low-sodium crackers. Whole-wheat flour tortillas. Meats and other proteins Skinless chicken or turkey. Ground chicken or turkey. Pork with fat trimmed off. Fish and seafood. Egg whites. Dried beans, peas, or lentils. Unsalted nuts, nut butters, and seeds. Unsalted canned beans. Lean cuts of beef with fat trimmed off. Low-sodium, lean precooked or cured meat, such as sausages or meat loaves. Dairy Low-fat (1%) or fat-free (skim) milk. Reduced-fat, low-fat, or fat-free cheeses. Nonfat, low-sodium ricotta or cottage cheese. Low-fat or nonfat yogurt. Low-fat, low-sodium cheese. Fats and oils Soft margarine without trans fats. Vegetable oil. Reduced-fat, low-fat, or light mayonnaise and salad dressings (reduced-sodium). Canola, safflower, olive, avocado, soybean, and sunflower oils. Avocado. Seasonings and condiments Herbs. Spices. Seasoning mixes without salt. Other foods Unsalted popcorn and pretzels. Fat-free sweets. The items listed above may not be a complete list of foods and beverages you can eat. Contact a dietitian for more information. What foods should I avoid? Fruits Canned fruit in a light or heavy syrup. Fried fruit. Fruit in cream or butter sauce. Vegetables Creamed or fried vegetables. Vegetables in a cheese sauce. Regular canned vegetables (not low-sodium or reduced-sodium). Regular canned tomato sauce and paste (not low-sodium or reduced-sodium). Regular tomato and vegetable juice (not low-sodium or reduced-sodium). Pickles. Olives. Grains Baked goods made with fat, such as croissants, muffins, or some breads. Dry pasta or rice meal packs. Meats and other proteins Fatty cuts of meat. Ribs. Fried meat. Bacon. Bologna, salami, and other precooked or cured meats, such as sausages or meat loaves. Fat from the back of a pig (fatback). Bratwurst. Salted nuts and seeds. Canned beans with added salt. Canned or smoked fish.  Whole eggs or egg yolks. Chicken or turkey with skin. Dairy Whole or 2% milk, cream, and half-and-half. Whole or full-fat cream cheese. Whole-fat or sweetened yogurt. Full-fat cheese. Nondairy creamers. Whipped toppings. Processed cheese and cheese spreads. Fats and oils Butter. Stick margarine. Lard. Shortening. Ghee. Bacon fat. Tropical oils, such as coconut, palm kernel, or palm oil. Seasonings and condiments Onion salt, garlic salt, seasoned salt, table salt, and sea salt. Worcestershire sauce. Tartar sauce. Barbecue sauce. Teriyaki sauce. Soy sauce, including reduced-sodium. Steak sauce. Canned and packaged gravies. Fish sauce. Oyster sauce. Cocktail sauce. Store-bought horseradish. Ketchup. Mustard. Meat flavorings and tenderizers. Bouillon cubes. Hot sauces. Pre-made or packaged marinades. Pre-made or packaged taco seasonings. Relishes. Regular salad dressings. Other foods Salted popcorn and pretzels. The items listed above may not be a complete list of foods and beverages you should avoid. Contact a dietitian for more information. Where to find more information National Heart, Lung, and Blood Institute: www.nhlbi.nih.gov American Heart Association: www.heart.org Academy of Nutrition and Dietetics: www.eatright.org National Kidney Foundation: www.kidney.org Summary The DASH eating plan is a healthy eating plan that has been shown to reduce high blood pressure (hypertension). It may also reduce your risk for type 2 diabetes, heart disease, and stroke. When on the DASH eating plan, aim to eat more fresh fruits and vegetables, whole grains, lean proteins, low-fat dairy, and heart-healthy fats. With the DASH   eating plan, you should limit salt (sodium) intake to 2,300 mg a day. If you have hypertension, you may need to reduce your sodium intake to 1,500 mg a day. Work with your health care provider or dietitian to adjust your eating plan to your individual calorie needs. This information is not  intended to replace advice given to you by your health care provider. Make sure you discuss any questions you have with your health care provider. Document Revised: 05/09/2019 Document Reviewed: 05/09/2019 Elsevier Patient Education  2022 [place].   Prediabetes Prediabetes is when your blood sugar (blood glucose) level is higher than normal but not high enough for you to be diagnosed with type 2 diabetes. Having prediabetes puts you at risk for developing type 2 diabetes (type 2 diabetes mellitus). With certain lifestyle changes, you may be able to prevent or delay the onset of type 2 diabetes. This is important because type 2 diabetes can lead to serious complications, such as: Heart disease. Stroke. Blindness. Kidney disease. Depression. Poor circulation in the feet and legs. In severe cases, this could lead to surgical removal of a leg (amputation). What are the causes? The exact cause of prediabetes is not known. It may result from insulin resistance. Insulin resistance develops when cells in the body do not respond properly to insulin that the body makes. This can cause excess glucose to build up in the blood. High blood glucose (hyperglycemia) can develop. What increases the risk? The following factors may make you more likely to develop this condition: You have a family member with type 2 diabetes. You are older than 45 years. You had a temporary form of diabetes during a pregnancy (gestational diabetes). You had polycystic ovary syndrome (PCOS). You are overweight or obese. You are inactive (sedentary). You have a history of heart disease, including problems with cholesterol levels, high levels of blood fats, or high blood pressure. What are the signs or symptoms? You may have no symptoms. If you do have symptoms, they may include: Increased hunger. Increased thirst. Increased urination. Vision changes, such as blurry vision. Tiredness (fatigue). How is this  diagnosed? This condition can be diagnosed with blood tests. Your blood glucose may be checked with one or more of the following tests: A fasting blood glucose (FBG) test. You will not be allowed to eat (you will fast) for at least 8 hours before a blood sample is taken. An A1C blood test (hemoglobin A1C). This test provides information about blood glucose levels over the previous 2?3 months. An oral glucose tolerance test (OGTT). This test measures your blood glucose at two points in time: After fasting. This is your baseline level. Two hours after you drink a beverage that contains glucose. You may be diagnosed with prediabetes if: Your FBG is 100?125 mg/dL (5.6-6.9 mmol/L). Your A1C level is 5.7?6.4% (39-46 mmol/mol). Your OGTT result is 140?199 mg/dL (7.8-11 mmol/L). These blood tests may be repeated to confirm your diagnosis. How is this treated? Treatment may include dietary and lifestyle changes to help lower your blood glucose and prevent type 2 diabetes from developing. In some cases, medicine may be prescribed to help lower the risk of type 2 diabetes. Follow these instructions at home: Nutrition  Follow a healthy meal plan. This includes eating lean proteins, whole grains, legumes, fresh fruits and vegetables, low-fat dairy products, and healthy fats. Follow instructions from your health care provider about eating or drinking restrictions. Meet with a dietitian to create a healthy eating plan that is right  for you. Lifestyle Do moderate-intensity exercise for at least 30 minutes a day on 5 or more days each week, or as told by your health care provider. A mix of activities may be best, such as: Brisk walking, swimming, biking, and weight lifting. Lose weight as told by your health care provider. Losing 5-7% of your body weight can reverse insulin resistance. Do not drink alcohol if: Your health care provider tells you not to drink. You are pregnant, may be pregnant, or are  planning to become pregnant. If you drink alcohol: Limit how much you use to: 0-1 drink a day for women. 0-2 drinks a day for men. Be aware of how much alcohol is in your drink. In the [place] one drink equals one 12 oz bottle of beer (355 mL), one 5 oz glass of wine (148 mL), or one 1 oz glass of hard liquor (44 mL). General instructions Take over-the-counter and prescription medicines only as told by your health care provider. You may be prescribed medicines that help lower the risk of type 2 diabetes. Do not use any products that contain nicotine or tobacco, such as cigarettes, e-cigarettes, and chewing tobacco. If you need help quitting, ask your health care provider. Keep all follow-up visits. This is important. Where to find more information American Diabetes Association: www.diabetes.org Academy of Nutrition and Dietetics: www.eatright.org American Heart Association: www.heart.org Contact a health care provider if: You have any of these symptoms: Increased hunger. Increased urination. Increased thirst. Fatigue. Vision changes, such as blurry vision. Get help right away if you: Have shortness of breath. Feel confused. Vomit or feel like you may vomit. Summary Prediabetes is when your blood sugar (blood glucose)level is higher than normal but not high enough for you to be diagnosed with type 2 diabetes. Having prediabetes puts you at risk for developing type 2 diabetes (type 2 diabetes mellitus). Make lifestyle changes such as eating a healthy diet and exercising regularly to help prevent diabetes. Lose weight as told by your health care provider. This information is not intended to replace advice given to you by your health care provider. Make sure you discuss any questions you have with your health care provider. Document Revised: 09/04/2019 Document Reviewed: 09/04/2019 Elsevier Patient Education  [place].

## 2021-05-30 NOTE — Progress Notes (Signed)
Established Patient Office Visit  Subjective:  Patient ID: John Franco, male    DOB: [birth year]  Age: 70 y.o. MRN: [id number]  CC:  Chief Complaint  Patient presents with   Follow-up   Hypertension    HPI Gwynn Crossley presents for follow up on hypertension. Pt denies chest pain, palpitations, dizziness, shortness of breath, or lower extremity swelling. Taking medication as directed without side effects. Has not checked BP at home lately. Pt tries to monitor sodium intake as best as possible.   Prediabetes: Denies increased thirst or urination. Reports trying to watch his sweets and carb intake. Diet consists of salads, chicken, nuts, fruits such as apples and dried fruit. Sometimes does have potato chips and cookies.    Past Medical History:  Diagnosis Date   Hypertension     Past Surgical History:  Procedure Laterality Date   FOOT SURGERY      Family History  Problem Relation Age of Onset   Arthritis Mother    Cancer Mother        breast   Heart attack Father    Cancer Brother        colon   Cancer Maternal Grandmother        breast   Pulmonary embolism Maternal Grandfather    Cancer Paternal Grandmother        ovarian   Cancer Paternal Grandfather        prostate    Social History   Socioeconomic History   Marital status: Married    Spouse name: Not on file   Number of children: Not on file   Years of education: Not on file   Highest education level: Not on file  Occupational History   Not on file  Tobacco Use   Smoking status: Former    Types: Pipe    Quit date: 06/19/1978    Years since quitting: 42.9   Smokeless tobacco: Never  Vaping Use   Vaping Use: Never used  Substance and Sexual Activity   Alcohol use: Yes    Comment: very seldom   Drug use: No   Sexual activity: Not Currently    Birth control/protection: Abstinence  Other Topics Concern   Not on file  Social History Narrative   Not on file   Social Determinants of Health    Financial Resource Strain: Low Risk    Difficulty of Paying Living Expenses: Not very hard  Food Insecurity: No Food Insecurity   Worried About Running Out of Food in the Last Year: Never true   [place] in the Last Year: Never true  Transportation Needs: No Transportation Needs   Lack of Transportation (Medical): No   Lack of Transportation (Non-Medical): No  Physical Activity: Inactive   Days of Exercise per Week: 0 days   Minutes of Exercise per Session: 0 min  Stress: No Stress Concern Present   Feeling of Stress : Not at all  Social Connections: Unknown   Frequency of Communication with Friends and Family: More than three times a week   Frequency of Social Gatherings with Friends and Family: More than three times a week   Attends Religious Services: 1 to 4 times per year   Active Member of [employer] or Organizations: Yes   Attends Archivist Meetings: 1 to 4 times per year   Marital Status: Not on file  Intimate Partner Violence: Not At Risk   Fear of Current or Ex-Partner: No   Emotionally Abused: No  Physically Abused: No   Sexually Abused: No    Outpatient Medications Prior to Visit  Medication Sig Dispense Refill   valsartan-hydrochlorothiazide (DIOVAN-HCT) 80-12.5 MG tablet TAKE 1 TABLET BY MOUTH EVERY DAY 90 tablet 0   doxycycline (VIBRA-TABS) 100 MG tablet Take 1 tablet (100 mg total) by mouth 2 (two) times daily. 20 tablet 0   No facility-administered medications prior to visit.    Allergies  Allergen Reactions   Codeine Other (See Comments)    jittery    ROS Review of Systems Review of Systems:  A fourteen system review of systems was performed and found to be positive as per HPI.   Objective:    Physical Exam General:  Well Developed, well nourished, appropriate for stated age.  Neuro:  Alert and oriented,  extra-ocular muscles intact  HEENT:  Normocephalic, atraumatic, neck supple Skin:  no gross rash, warm, pink. Cardiac:  RRR,  S1 S2 Respiratory: CTA B/L w/o wheezing, crackles or rales. Vascular:  Ext warm, no cyanosis apprec.; cap RF less 2 sec. No gross edema. Psych:  No HI/SI, judgement and insight good, Euthymic mood. Full Affect.  BP 111/68   Pulse 78   Temp 98.2 F (36.8 C)   Ht 5' 6"  (1.676 m)   Wt 206 lb (93.4 kg)   SpO2 96%   BMI 33.25 kg/m  Wt Readings from Last 3 Encounters:  05/30/21 206 lb (93.4 kg)  11/12/20 202 lb 12.8 oz (92 kg)  09/30/20 210 lb 4.8 oz (95.4 kg)     Health Maintenance Due  Topic Date Due   COVID-19 Vaccine (1) Never done   Hepatitis C Screening  Never done   TETANUS/TDAP  Never done   COLONOSCOPY (Pts 45-91yr Insurance coverage will need to be confirmed)  Never done   Zoster Vaccines- Shingrix (1 of 2) Never done   Pneumonia Vaccine 70 Years old (1 - PCV) Never done   INFLUENZA VACCINE  01/17/2021    There are no preventive care reminders to display for this patient.  Lab Results  Component Value Date   TSH 1.470 10/07/2020   Lab Results  Component Value Date   WBC 7.1 10/07/2020   HGB 15.4 10/07/2020   HCT 46.2 10/07/2020   MCV 89 10/07/2020   PLT 263 10/07/2020   Lab Results  Component Value Date   NA 140 10/07/2020   K 4.4 10/07/2020   CO2 24 10/07/2020   GLUCOSE 96 10/07/2020   BUN 20 10/07/2020   CREATININE 1.09 10/07/2020   BILITOT 0.2 10/07/2020   ALKPHOS 80 10/07/2020   AST 20 10/07/2020   ALT 17 10/07/2020   PROT 6.8 10/07/2020   ALBUMIN 4.1 10/07/2020   CALCIUM 9.5 10/07/2020   EGFR 73 10/07/2020   Lab Results  Component Value Date   CHOL 167 10/07/2020   Lab Results  Component Value Date   HDL 43 10/07/2020   Lab Results  Component Value Date   LDLCALC 95 10/07/2020   Lab Results  Component Value Date   TRIG 167 (H) 10/07/2020   Lab Results  Component Value Date   CHOLHDL 3.9 10/07/2020   Lab Results  Component Value Date   HGBA1C 6.0 (H) 10/07/2020      Assessment & Plan:   Problem List Items Addressed  This Visit       Other   Pre-diabetes - Primary   Other Visit Diagnoses     Hypertension, unspecified type  Relevant Medications   valsartan-hydrochlorothiazide (DIOVAN-HCT) 80-12.5 MG tablet      Hypertension: -Stable. -Continue current medication regimen. Pt deferred blood work today, advised will need to collect at follow up visit. Pt verbalized understanding. -Discussed DASH diet and provided handout. -Will continue to monitor.  Pre-diabetes: -Last A1c 6.0, will repeat A1c at follow up visit. Pt asx. -Discussed low carbohydrate and glucose diet. -Will continue to monitor.   Meds ordered this encounter  Medications   valsartan-hydrochlorothiazide (DIOVAN-HCT) 80-12.5 MG tablet    Sig: Take 1 tablet by mouth daily.    Dispense:  90 tablet    Refill:  1    Follow-up: Return in about 6 months (around 11/28/2021) for HTN, Predibetes and FBW .    Lorrene Reid, PA-C

## 2021-11-28 ENCOUNTER — Ambulatory Visit (INDEPENDENT_AMBULATORY_CARE_PROVIDER_SITE_OTHER): Payer: PPO | Admitting: Physician Assistant

## 2021-11-28 ENCOUNTER — Encounter: Payer: Self-pay | Admitting: Physician Assistant

## 2021-11-28 VITALS — BP 114/66 | HR 73 | Temp 97.1°F | Ht 66.14 in | Wt 212.1 lb

## 2021-11-28 DIAGNOSIS — I10 Essential (primary) hypertension: Secondary | ICD-10-CM

## 2021-11-28 DIAGNOSIS — G8929 Other chronic pain: Secondary | ICD-10-CM | POA: Diagnosis not present

## 2021-11-28 DIAGNOSIS — M25561 Pain in right knee: Secondary | ICD-10-CM

## 2021-11-28 DIAGNOSIS — E7800 Pure hypercholesterolemia, unspecified: Secondary | ICD-10-CM

## 2021-11-28 DIAGNOSIS — R7303 Prediabetes: Secondary | ICD-10-CM

## 2021-11-28 MED ORDER — VALSARTAN-HYDROCHLOROTHIAZIDE 80-12.5 MG PO TABS: 1.0000 | ORAL_TABLET | Freq: Every day | ORAL | 1 refills | Status: DC

## 2021-11-28 NOTE — Progress Notes (Signed)
Established patient visit   Patient: John Franco   DOB: 03-31-1951   71 y.o. Male  MRN: [id number] Visit Date: 11/28/2021  Chief Complaint  Patient presents with   Hypertension   Subjective    HPI  Patient presents for chronic follow-up. Patient reports his right knee has been bothering him a little more. States has been using a brace which has been helpful.    HTN: Pt denies chest pain, palpitations, dizziness, headache or daily lower extremity swelling. Taking medication as directed without side effects. Has not checked blood pressure at home.   HLD: Pt trying to manage with diet. States does eat fast food or out.   Prediabetes: No increased thirst or urination. Patient reports has a regular diet which includes carbohydrates.    Medications: Outpatient Medications Prior to Visit  Medication Sig   [DISCONTINUED] valsartan-hydrochlorothiazide (DIOVAN-HCT) 80-12.5 MG tablet Take 1 tablet by mouth daily.   No facility-administered medications prior to visit.    Review of Systems Review of Systems:  A fourteen system review of systems was performed and found to be positive as per HPI.   Last CBC Lab Results  Component Value Date   WBC 7.1 10/07/2020   HGB 15.4 10/07/2020   HCT 46.2 10/07/2020   MCV 89 10/07/2020   MCH 29.5 10/07/2020   RDW 12.6 10/07/2020   PLT 263 76/54/6503   Last metabolic panel Lab Results  Component Value Date   GLUCOSE 96 10/07/2020   NA 140 10/07/2020   K 4.4 10/07/2020   CL 101 10/07/2020   CO2 24 10/07/2020   BUN 20 10/07/2020   CREATININE 1.09 10/07/2020   EGFR 73 10/07/2020   CALCIUM 9.5 10/07/2020   PROT 6.8 10/07/2020   ALBUMIN 4.1 10/07/2020   LABGLOB 2.7 10/07/2020   AGRATIO 1.5 10/07/2020   BILITOT 0.2 10/07/2020   ALKPHOS 80 10/07/2020   AST 20 10/07/2020   ALT 17 10/07/2020   Last lipids Lab Results  Component Value Date   CHOL 167 10/07/2020   HDL 43 10/07/2020   LDLCALC 95 10/07/2020   TRIG 167 (H) 10/07/2020    CHOLHDL 3.9 10/07/2020   Last hemoglobin A1c Lab Results  Component Value Date   HGBA1C 6.0 (H) 10/07/2020   Last thyroid functions Lab Results  Component Value Date   TSH 1.470 10/07/2020   Last vitamin D Lab Results  Component Value Date   VD25OH 23.8 (L) 01/06/2019     Objective    BP 114/66   Pulse 73   Temp (!) 97.1 F (36.2 C)   Ht 5' 6.14" (1.68 m)   Wt 212 lb 1.9 oz (96.2 kg)   SpO2 96%   BMI 34.09 kg/m  BP Readings from Last 3 Encounters:  11/28/21 114/66  05/30/21 111/68  11/12/20 110/68   Wt Readings from Last 3 Encounters:  11/28/21 212 lb 1.9 oz (96.2 kg)  05/30/21 206 lb (93.4 kg)  11/12/20 202 lb 12.8 oz (92 kg)    Physical Exam  General:  Well Developed, well nourished, appropriate for stated age.  Neuro:  Alert and oriented,  extra-ocular muscles intact  HEENT:  Normocephalic, atraumatic, normal TM's of both ears, neck supple  Skin:  no gross rash, warm, pink. Cardiac:  RRR, S1 S2 Respiratory: CTA B/L  Vascular:  Ext warm, no cyanosis apprec.; cap RF less 2 sec. Psych:  No HI/SI, judgement and insight good, Euthymic mood. Full Affect.   No results found for any visits on  11/28/21.  Assessment & Plan      Problem List Items Addressed This Visit       Cardiovascular and Mediastinum   Hypertension - Primary    -Controlled. -Will collect CMP for medication monitoring.  -Will continue to monitor.      Relevant Medications   valsartan-hydrochlorothiazide (DIOVAN-HCT) 80-12.5 MG tablet   Other Relevant Orders   Comp Met (CMET)     Other   Pre-diabetes    -Last A1c 6.0, will repeat A1c today. -Recommend to follow a low carbohydrate and glucose diet.  -Will continue to monitor.      Relevant Orders   HgB A1c   Elevated LDL cholesterol level    -Last lipid panel: HLD 43, LDL 95 -Patient is non-fasting today so advised to fast for follow-up visit to repeat lipid panel. Discussed low fat diet. Will continue to monitor.       Other Visit Diagnoses     Chronic pain of right knee          Chronic pain of right knee: -Discussed with patient likely OA. Recommend to continue with conservative therapy. If symptoms fail to improve or worsen then recommend referral to orthopedics.   Return in about 6 months (around 05/30/2022) for [place] and [place].        Lorrene Reid, PA-C  [facility] [phone] (phone) [phone] (fax)  [place]

## 2021-11-28 NOTE — Patient Instructions (Signed)

## 2021-11-28 NOTE — Assessment & Plan Note (Signed)
-  Last A1c 6.0, will repeat A1c today. -Recommend to follow a low carbohydrate and glucose diet.  -Will continue to monitor.

## 2021-11-28 NOTE — Assessment & Plan Note (Signed)
-  Last lipid panel: HLD 43, LDL 95 -Patient is non-fasting today so advised to fast for follow-up visit to repeat lipid panel. Discussed low fat diet. Will continue to monitor.

## 2021-11-28 NOTE — Assessment & Plan Note (Signed)
-  Controlled. -Will collect CMP for medication monitoring.  -Will continue to monitor.

## 2021-11-29 LAB — COMPREHENSIVE METABOLIC PANEL
ALT: 12 IU/L (ref 0–44)
AST: 17 IU/L (ref 0–40)
Albumin/Globulin Ratio: 1.8 (ref 1.2–2.2)
Albumin: 4.3 g/dL (ref 3.8–4.8)
Alkaline Phosphatase: 74 IU/L (ref 44–121)
BUN/Creatinine Ratio: 19 (ref 10–24)
BUN: 20 mg/dL (ref 8–27)
Bilirubin Total: 0.4 mg/dL (ref 0.0–1.2)
CO2: 24 mmol/L (ref 20–29)
Calcium: 9.2 mg/dL (ref 8.6–10.2)
Chloride: 102 mmol/L (ref 96–106)
Creatinine, Ser: 1.07 mg/dL (ref 0.76–1.27)
Globulin, Total: 2.4 g/dL (ref 1.5–4.5)
Glucose: 96 mg/dL (ref 70–99)
Potassium: 4 mmol/L (ref 3.5–5.2)
Sodium: 139 mmol/L (ref 134–144)
Total Protein: 6.7 g/dL (ref 6.0–8.5)
eGFR: 75 mL/min/{1.73_m2} (ref >59)

## 2021-11-29 LAB — HEMOGLOBIN A1C
Est. average glucose Bld gHb Est-mCnc: 126 mg/dL
Hgb A1c MFr Bld: 6 % — ABNORMAL HIGH (ref 4.8–5.6)

## 2022-05-30 ENCOUNTER — Encounter: Payer: PPO | Admitting: Physician Assistant

## 2022-06-14 ENCOUNTER — Other Ambulatory Visit: Payer: Self-pay | Admitting: Physician Assistant

## 2022-06-14 DIAGNOSIS — E7800 Pure hypercholesterolemia, unspecified: Secondary | ICD-10-CM

## 2022-06-14 DIAGNOSIS — Z0000 Encounter for general adult medical examination without abnormal findings: Secondary | ICD-10-CM

## 2022-06-14 DIAGNOSIS — R7303 Prediabetes: Secondary | ICD-10-CM

## 2022-06-14 DIAGNOSIS — I10 Essential (primary) hypertension: Secondary | ICD-10-CM

## 2022-06-27 ENCOUNTER — Other Ambulatory Visit: Payer: PPO

## 2022-07-04 ENCOUNTER — Encounter: Payer: Self-pay | Admitting: Nurse Practitioner

## 2022-07-04 ENCOUNTER — Ambulatory Visit (INDEPENDENT_AMBULATORY_CARE_PROVIDER_SITE_OTHER): Payer: PPO | Admitting: Nurse Practitioner

## 2022-07-04 VITALS — BP 136/87 | HR 71 | Ht 64.0 in | Wt 210.1 lb

## 2022-07-04 DIAGNOSIS — R7303 Prediabetes: Secondary | ICD-10-CM | POA: Diagnosis not present

## 2022-07-04 DIAGNOSIS — Z0000 Encounter for general adult medical examination without abnormal findings: Secondary | ICD-10-CM

## 2022-07-04 DIAGNOSIS — Z1211 Encounter for screening for malignant neoplasm of colon: Secondary | ICD-10-CM | POA: Diagnosis not present

## 2022-07-04 DIAGNOSIS — I10 Essential (primary) hypertension: Secondary | ICD-10-CM

## 2022-07-04 NOTE — Progress Notes (Signed)
Subjective:   John Franco is a 72 y.o. male who presents for Medicare Annual/Subsequent preventive examination. - due for routine, fasting labs - already ordered in EPIC. He will need to schedule lab visit for this.  -will be referred for colon cancer screening. -He denies chest pain, chest pressure, or shortness of breath. He denies headaches or visual disturbances. He denies abdominal pain, nausea, vomiting, or changes in bowel or bladder habits.    Review of Systems    See HPI        Objective:    Today's Vitals   07/04/22 1353  BP: 136/87  Pulse: 71  SpO2: 98%  Weight: 210 lb 1.9 oz (95.3 kg)  Height: '5\' 4"'$  (1.626 m)   Body mass index is 36.07 kg/m.    Row Labels 07/31/2018   11:03 AM  Advanced Directives   Section Header. No data exists in this row.   Does Patient Have a Medical Advance Directive?   No  Would patient like information on creating a medical advance directive?   No - Patient declined    Current Medications (verified) Outpatient Encounter Medications as of 07/04/2022  Medication Sig   valsartan-hydrochlorothiazide (DIOVAN-HCT) 80-12.5 MG tablet Take 1 tablet by mouth daily.   No facility-administered encounter medications on file as of 07/04/2022.    Allergies (verified) Codeine   History: Past Medical History:  Diagnosis Date   Hypertension    Past Surgical History:  Procedure Laterality Date   FOOT SURGERY     Family History  Problem Relation Age of Onset   Arthritis Mother    Cancer Mother        breast   Heart attack Father    Cancer Brother        colon   Cancer Maternal Grandmother        breast   Pulmonary embolism Maternal Grandfather    Cancer Paternal Grandmother        ovarian   Cancer Paternal Grandfather        prostate   Social History   Socioeconomic History   Marital status: Married    Spouse name: Not on file   Number of children: Not on file   Years of education: Not on file   Highest education level: Not  on file  Occupational History   Not on file  Tobacco Use   Smoking status: Former    Types: Pipe    Quit date: 06/19/1978    Years since quitting: 44.1   Smokeless tobacco: Never  Vaping Use   Vaping Use: Never used  Substance and Sexual Activity   Alcohol use: Yes    Comment: very seldom   Drug use: No   Sexual activity: Not Currently    Birth control/protection: Abstinence  Other Topics Concern   Not on file  Social History Narrative   Not on file   Social Determinants of Health   Financial Resource Strain: Low Risk  (07/04/2022)   Overall Financial Resource Strain (CARDIA)    Difficulty of Paying Living Expenses: Not very hard  Food Insecurity: No Food Insecurity (07/04/2022)   Hunger Vital Sign    Worried About Running Out of Food in the Last Year: Never true    Ran Out of Food in the Last Year: Never true  Transportation Needs: No Transportation Needs (03/13/2021)   PRAPARE - Hydrologist (Medical): No    Lack of Transportation (Non-Medical): No  Physical Activity: Sufficiently  Active (07/04/2022)   Exercise Vital Sign    Days of Exercise per Week: 5 days    Minutes of Exercise per Session: 40 min  Stress: No Stress Concern Present (07/04/2022)   [place]    Feeling of Stress : Not at all  Social Connections: Moderately Integrated (07/04/2022)   Social Connection and Isolation Panel [NHANES]    Frequency of Communication with Friends and Family: More than three times a week    Frequency of Social Gatherings with Friends and Family: More than three times a week    Attends Religious Services: 1 to 4 times per year    Active Member of [employer] or Organizations: No    Attends Archivist Meetings: Never    Marital Status: Married   Physical Exam Vitals and nursing note reviewed.  Constitutional:      Appearance: Normal appearance. He is well-developed.  HENT:      Head: Normocephalic and atraumatic.     Right Ear: Tympanic membrane, ear canal and external ear normal.     Left Ear: Tympanic membrane, ear canal and external ear normal.     Nose: Nose normal.     Mouth/Throat:     Mouth: Mucous membranes are moist.     Pharynx: Oropharynx is clear.  Eyes:     Extraocular Movements: Extraocular movements intact.     Conjunctiva/sclera: Conjunctivae normal.     Pupils: Pupils are equal, round, and reactive to light.  Neck:     Vascular: No carotid bruit.  Cardiovascular:     Rate and Rhythm: Normal rate and regular rhythm.     Pulses: Normal pulses.     Heart sounds: Normal heart sounds.  Pulmonary:     Effort: Pulmonary effort is normal.     Breath sounds: Normal breath sounds.  Abdominal:     General: Bowel sounds are normal. There is no distension.     Palpations: Abdomen is soft. There is no mass.     Tenderness: There is no abdominal tenderness. There is no right CVA tenderness, left CVA tenderness, guarding or rebound.     Hernia: No hernia is present.  Musculoskeletal:        General: Normal range of motion.     Cervical back: Normal range of motion and neck supple.  Lymphadenopathy:     Cervical: No cervical adenopathy.  Skin:    General: Skin is warm and dry.     Capillary Refill: Capillary refill takes less than 2 seconds.  Neurological:     General: No focal deficit present.     Mental Status: He is alert and oriented to person, place, and time.  Psychiatric:        Mood and Affect: Mood normal.        Behavior: Behavior normal.        Thought Content: Thought content normal.        Judgment: Judgment normal.      Tobacco Counseling Counseling given: Not Answered   Clinical Intake:  Pre-visit preparation completed: Yes  Pain : No/denies pain     BMI - recorded: 36.07 Nutritional Status: BMI > 30  Obese Diabetes: No  How often do you need to have someone help you when you read instructions, pamphlets, or other  written materials from your doctor or pharmacy?: 1 - Never  Diabetic?no  Interpreter Needed?: No      Activities of Daily Living  Row Labels 07/04/2022    2:11 PM  In your present state of health, do you have any difficulty performing the following activities:   Section Header. No data exists in this row.   Hearing?   0  Vision?   1  Difficulty concentrating or making decisions?   0  Walking or climbing stairs?   1  Dressing or bathing?   0  Doing errands, shopping?   0    Patient Care Team: Lorrene Reid, PA-C as PCP - General (Physician Assistant)  Indicate any recent Medical Services you may have received from other than [facility] providers in the past year (date may be approximate).     Assessment:  1. Encounter for Medicare annual wellness exam Annual medicare wellness visit today   2. Primary hypertension Stable.  Continue BP medication as prescribed   3. Pre-diabetes Most recent HgbA1c  6.0. will check on upcoming labs and treat as indicated.   4. Screening for colon cancer .refer to GI for screening colonoscopy   - Ambulatory referral to Gastroenterology   Hearing/Vision screen No results found.   Depression Screen   Row Labels 07/04/2022    2:09 PM 11/28/2021    8:43 AM 05/30/2021    3:09 PM 03/13/2021   11:14 AM 11/12/2020   11:30 AM 09/30/2020    1:42 PM 09/23/2020   10:13 AM  PHQ 2/9 Scores   Section Header. No data exists in this row.         PHQ - 2 Score   2 2 0 0 0 0 0  PHQ- 9 Score   '6 5 4  2 '$ 0 0    Fall Risk   Row Labels 07/04/2022    2:11 PM 05/30/2021    3:09 PM 03/13/2021   11:14 AM 11/12/2020   11:04 AM 09/30/2020    1:42 PM  Fall Risk    Section Header. No data exists in this row.       Falls in the past year?   0 1 0 0 0  Number falls in past yr:   0 0 0 0   Injury with Fall?   0 0 0 0   Risk for fall due to :    History of fall(s);Impaired balance/gait Orthopedic patient    Follow up   Falls evaluation completed Falls evaluation  completed Falls evaluation completed Falls evaluation completed Falls evaluation completed    FALL RISK PREVENTION PERTAINING TO THE HOME:  Any stairs in or around the home? Yes  If so, are there any without handrails? Yes  Home free of loose throw rugs in walkways, pet beds, electrical cords, etc? Yes  Adequate lighting in your home to reduce risk of falls? Yes   ASSISTIVE DEVICES UTILIZED TO PREVENT FALLS:  Life alert? No  Use of a cane, walker or w/c? No  Grab bars in the bathroom? No  Shower chair or bench in shower? Yes  Elevated toilet seat or a handicapped toilet? Yes   TIMED UP AND GO:  Was the test performed? Yes .  Length of time to ambulate 10 feet: 10 sec.   Gait steady and fast without use of assistive device  Cognitive Function:       Row Labels 07/04/2022    2:00 PM 03/13/2021   11:16 AM  6CIT Screen   Section Header. No data exists in this row.    What Year?   0 points 0 points  What month?  0 points 0 points  What time?   0 points 0 points  Count back from 20   0 points 0 points  Months in reverse   2 points 0 points  Repeat phrase   0 points 0 points  Total Score   2 points 0 points    Immunizations Immunization History  Administered Date(s) Administered   Influenza, High Dose Seasonal PF 09/02/2018, 06/16/2021   PFIZER(Purple Top)SARS-COV-2 Vaccination 06/16/2021    TDAP status: Due, Education has been provided regarding the importance of this vaccine. Advised may receive this vaccine at local pharmacy or Health Dept. Aware to provide a copy of the vaccination record if obtained from local pharmacy or Health Dept. Verbalized acceptance and understanding.  Flu Vaccine status: Due, Education has been provided regarding the importance of this vaccine. Advised may receive this vaccine at local pharmacy or Health Dept. Aware to provide a copy of the vaccination record if obtained from local pharmacy or Health Dept. Verbalized acceptance and  understanding.  Pneumococcal vaccine status: Due, Education has been provided regarding the importance of this vaccine. Advised may receive this vaccine at local pharmacy or Health Dept. Aware to provide a copy of the vaccination record if obtained from local pharmacy or Health Dept. Verbalized acceptance and understanding.  Covid-19 vaccine status: Information provided on how to obtain vaccines.   Qualifies for Shingles Vaccine? Yes   Zostavax completed No   Shingrix Completed?: No.    Education has been provided regarding the importance of this vaccine. Patient has been advised to call insurance company to determine out of pocket expense if they have not yet received this vaccine. Advised may also receive vaccine at local pharmacy or Health Dept. Verbalized acceptance and understanding.  Screening Tests Health Maintenance  Topic Date Due   Hepatitis C Screening  Never done   DTaP/Tdap/Td (1 - Tdap) Never done   COLONOSCOPY (Pts 45-6yr Insurance coverage will need to be confirmed)  Never done   Zoster Vaccines- Shingrix (1 of 2) Never done   Pneumonia Vaccine 72 Years old (1 - PCV) Never done   INFLUENZA VACCINE  01/17/2022   COVID-19 Vaccine (2 - 2023-24 season) 02/17/2022   Medicare Annual Wellness (AWV)  07/05/2023   HPV VACCINES  Aged Out    Health Maintenance  Health Maintenance Due  Topic Date Due   Hepatitis C Screening  Never done   DTaP/Tdap/Td (1 - Tdap) Never done   COLONOSCOPY (Pts 45-441yrInsurance coverage will need to be confirmed)  Never done   Zoster Vaccines- Shingrix (1 of 2) Never done   Pneumonia Vaccine 6522Years old (1 - PCV) Never done   INFLUENZA VACCINE  01/17/2022   COVID-19 Vaccine (2 - 2023-24 season) 02/17/2022    Colorectal cancer screening: Referral to GI placed 07/04/2022. Pt aware the office will call re: appt.  Lung Cancer Screening: (Low Dose CT Chest recommended if Age 72-80ears, 30 pack-year currently smoking OR have quit w/in  15years.) does not qualify.   Lung Cancer Screening Referral: n/a  Additional Screening:  Hepatitis C Screening: does not qualify; Completed n/a  Vision Screening: Recommended annual ophthalmology exams for early detection of glaucoma and other disorders of the eye. Is the patient up to date with their annual eye exam?  No  Who is the provider or what is the name of the office in which the patient attends annual eye exams? no If pt is not established with a provider, would they like to  be referred to a provider to establish care? No .   Dental Screening: Recommended annual dental exams for proper oral hygiene  Community Resource Referral / Chronic Care Management: CRR required this visit?  No   CCM required this visit?  No      Plan:     I have personally reviewed and noted the following in the patient's chart:   Medical and social history Use of alcohol, tobacco or illicit drugs  Current medications and supplements including opioid prescriptions. Patient is not currently taking opioid prescriptions. Functional ability and status Nutritional status Physical activity Advanced directives List of other physicians Hospitalizations, surgeries, and ER visits in previous 12 months Vitals Screenings to include cognitive, depression, and falls Referrals and appointments  In addition, I have reviewed and discussed with patient certain preventive protocols, quality metrics, and best practice recommendations. A written personalized care plan for preventive services as well as general preventive health recommendations were provided to patient.     Ronnell Freshwater, NP   07/23/2022   Nurse Notes: 25 min face to face

## 2022-09-11 ENCOUNTER — Telehealth: Payer: Self-pay | Admitting: *Deleted

## 2022-09-11 DIAGNOSIS — I10 Essential (primary) hypertension: Secondary | ICD-10-CM

## 2022-09-11 MED ORDER — VALSARTAN-HYDROCHLOROTHIAZIDE 80-12.5 MG PO TABS: 1.0000 | ORAL_TABLET | Freq: Every day | ORAL | 1 refills | Status: DC

## 2022-09-11 NOTE — Telephone Encounter (Signed)
Pt calling needing refill on below.    valsartan-hydrochlorothiazide (DIOVAN-HCT) 80-12.5 MG tablet   [id number] - [place], [place] - [address]     Lov 07/04/22 ROV 10/03/22

## 2022-09-11 NOTE — Telephone Encounter (Signed)
Done

## 2022-09-25 ENCOUNTER — Other Ambulatory Visit: Payer: Self-pay

## 2022-09-25 DIAGNOSIS — E7800 Pure hypercholesterolemia, unspecified: Secondary | ICD-10-CM

## 2022-09-25 DIAGNOSIS — Z0000 Encounter for general adult medical examination without abnormal findings: Secondary | ICD-10-CM

## 2022-09-25 DIAGNOSIS — I10 Essential (primary) hypertension: Secondary | ICD-10-CM

## 2022-09-25 DIAGNOSIS — R7303 Prediabetes: Secondary | ICD-10-CM

## 2022-09-26 ENCOUNTER — Other Ambulatory Visit: Payer: PPO

## 2022-10-03 ENCOUNTER — Ambulatory Visit: Payer: PPO | Admitting: Family Medicine

## 2022-10-03 NOTE — Progress Notes (Deleted)
   Established Patient Office Visit  Subjective   Patient ID: John Franco, male    DOB: 1951-04-14  Age: 72 y.o. MRN: [id number]  No chief complaint on file.   HPI Duffy Dantonio is a 72 y.o. male presenting today for follow up of hypertension, hyperlipidemia, prediabetes. Hypertension: Patient here for follow-up of elevated blood pressure. He {is/is not:9024} exercising and {is/is not:9024} adherent to low salt diet.   Pt denies chest pain, SOB, dizziness, edema, syncope, fatigue or heart palpitations. Taking ***, reports {excellent/good/fair/poor:19665} compliance with treatment. Denies side effects. Hyperlipidemia: tolerating *** well with no myalgias or significant side effects. Currently consuming a {diet types:17450} diet. {types:19826} The 10-year ASCVD risk score (Arnett DK, et al., 2019) is: 24% Prediabetes: denies hypoglycemic events, wounds or sores that are not healing well, increased thirst or urination. Has been following *** diet and *** for exercise.  ROS Negative unless otherwise noted in HPI   Objective:     There were no vitals taken for this visit.  Physical Exam   No results found for any visits on 10/03/22.  {Labs (Optional):23779}  The 10-year ASCVD risk score (Arnett DK, et al., 2019) is: 24%    Assessment & Plan:  Primary hypertension  Pre-diabetes  Elevated LDL cholesterol level    No follow-ups on file.    Melida Quitter, PA

## 2022-11-23 ENCOUNTER — Other Ambulatory Visit: Payer: PPO

## 2022-11-23 DIAGNOSIS — I10 Essential (primary) hypertension: Secondary | ICD-10-CM | POA: Diagnosis not present

## 2022-11-23 DIAGNOSIS — Z0000 Encounter for general adult medical examination without abnormal findings: Secondary | ICD-10-CM

## 2022-11-23 DIAGNOSIS — E7800 Pure hypercholesterolemia, unspecified: Secondary | ICD-10-CM | POA: Diagnosis not present

## 2022-11-24 LAB — COMPREHENSIVE METABOLIC PANEL
ALT: 14 IU/L (ref 0–44)
AST: 14 IU/L (ref 0–40)
Albumin/Globulin Ratio: 1.6 (ref 1.2–2.2)
Albumin: 3.9 g/dL (ref 3.8–4.8)
Alkaline Phosphatase: 73 IU/L (ref 44–121)
BUN/Creatinine Ratio: 19 (ref 10–24)
BUN: 19 mg/dL (ref 8–27)
Bilirubin Total: 0.3 mg/dL (ref 0.0–1.2)
CO2: 22 mmol/L (ref 20–29)
Calcium: 9.3 mg/dL (ref 8.6–10.2)
Chloride: 104 mmol/L (ref 96–106)
Creatinine, Ser: 1 mg/dL (ref 0.76–1.27)
Globulin, Total: 2.4 g/dL (ref 1.5–4.5)
Glucose: 101 mg/dL — ABNORMAL HIGH (ref 70–99)
Potassium: 4.2 mmol/L (ref 3.5–5.2)
Sodium: 139 mmol/L (ref 134–144)
Total Protein: 6.3 g/dL (ref 6.0–8.5)
eGFR: 80 mL/min/{1.73_m2} (ref >59)

## 2022-11-24 LAB — CBC WITH DIFFERENTIAL/PLATELET
Basophils Absolute: 0 10*3/uL (ref 0.0–0.2)
Basos: 1 %
EOS (ABSOLUTE): 0.2 10*3/uL (ref 0.0–0.4)
Eos: 3 %
Hematocrit: 42.8 % (ref 37.5–51.0)
Hemoglobin: 13.9 g/dL (ref 13.0–17.7)
Immature Grans (Abs): 0 10*3/uL (ref 0.0–0.1)
Immature Granulocytes: 0 %
Lymphocytes Absolute: 1.7 10*3/uL (ref 0.7–3.1)
Lymphs: 26 %
MCH: 29 pg (ref 26.6–33.0)
MCHC: 32.5 g/dL (ref 31.5–35.7)
MCV: 89 fL (ref 79–97)
Monocytes Absolute: 0.5 10*3/uL (ref 0.1–0.9)
Monocytes: 8 %
Neutrophils Absolute: 4.1 10*3/uL (ref 1.4–7.0)
Neutrophils: 62 %
Platelets: 226 10*3/uL (ref 150–450)
RBC: 4.79 x10E6/uL (ref 4.14–5.80)
RDW: 13.2 % (ref 11.6–15.4)
WBC: 6.6 10*3/uL (ref 3.4–10.8)

## 2022-11-24 LAB — HEMOGLOBIN A1C
Est. average glucose Bld gHb Est-mCnc: 123 mg/dL
Hgb A1c MFr Bld: 5.9 % — ABNORMAL HIGH (ref 4.8–5.6)

## 2022-11-24 LAB — LIPID PANEL
Chol/HDL Ratio: 5.4 ratio — ABNORMAL HIGH (ref 0.0–5.0)
Cholesterol, Total: 195 mg/dL (ref 100–199)
HDL: 36 mg/dL — ABNORMAL LOW (ref >39)
LDL Chol Calc (NIH): 136 mg/dL — ABNORMAL HIGH (ref 0–99)
Triglycerides: 127 mg/dL (ref 0–149)
VLDL Cholesterol Cal: 23 mg/dL (ref 5–40)

## 2022-11-24 LAB — TSH: TSH: 1.51 u[IU]/mL (ref 0.450–4.500)

## 2022-11-30 ENCOUNTER — Encounter: Payer: Self-pay | Admitting: Family Medicine

## 2022-11-30 ENCOUNTER — Ambulatory Visit (INDEPENDENT_AMBULATORY_CARE_PROVIDER_SITE_OTHER): Payer: PPO | Admitting: Family Medicine

## 2022-11-30 VITALS — BP 120/70 | HR 67 | Resp 18 | Ht 64.0 in | Wt 204.0 lb

## 2022-11-30 DIAGNOSIS — K4090 Unilateral inguinal hernia, without obstruction or gangrene, not specified as recurrent: Secondary | ICD-10-CM | POA: Diagnosis not present

## 2022-11-30 DIAGNOSIS — R7303 Prediabetes: Secondary | ICD-10-CM | POA: Diagnosis not present

## 2022-11-30 DIAGNOSIS — I10 Essential (primary) hypertension: Secondary | ICD-10-CM | POA: Diagnosis not present

## 2022-11-30 DIAGNOSIS — R21 Rash and other nonspecific skin eruption: Secondary | ICD-10-CM

## 2022-11-30 DIAGNOSIS — G8929 Other chronic pain: Secondary | ICD-10-CM | POA: Diagnosis not present

## 2022-11-30 DIAGNOSIS — E7800 Pure hypercholesterolemia, unspecified: Secondary | ICD-10-CM

## 2022-11-30 DIAGNOSIS — Z23 Encounter for immunization: Secondary | ICD-10-CM

## 2022-11-30 DIAGNOSIS — Z1211 Encounter for screening for malignant neoplasm of colon: Secondary | ICD-10-CM

## 2022-11-30 DIAGNOSIS — M25561 Pain in right knee: Secondary | ICD-10-CM | POA: Diagnosis not present

## 2022-11-30 MED ORDER — TETANUS-DIPHTH-ACELL PERTUSSIS 5-2.5-18.5 LF-MCG/0.5 IM SUSY
0.5000 mL | PREFILLED_SYRINGE | Freq: Once | INTRAMUSCULAR | 0 refills | Status: AC
Start: 2022-11-30 — End: 2022-11-30

## 2022-11-30 MED ORDER — TRIAMCINOLONE ACETONIDE 0.1 % EX CREA
1.0000 | TOPICAL_CREAM | Freq: Two times a day (BID) | CUTANEOUS | 1 refills | Status: AC
Start: 2022-11-30 — End: ?

## 2022-11-30 MED ORDER — VALSARTAN-HYDROCHLOROTHIAZIDE 80-12.5 MG PO TABS
1.0000 | ORAL_TABLET | Freq: Every day | ORAL | 0 refills | Status: DC
Start: 2022-11-30 — End: 2023-02-26

## 2022-11-30 MED ORDER — PNEUMOCOCCAL 20-VAL CONJ VACC 0.5 ML IM SUSY
0.5000 mL | PREFILLED_SYRINGE | INTRAMUSCULAR | 0 refills | Status: AC
Start: 2022-11-30 — End: 2022-11-30

## 2022-11-30 MED ORDER — SHINGRIX 50 MCG/0.5ML IM SUSR
0.5000 mL | Freq: Once | INTRAMUSCULAR | 0 refills | Status: AC
Start: 2022-11-30 — End: 2022-11-30

## 2022-11-30 NOTE — Progress Notes (Signed)
Established Patient Office Visit  Subjective   Patient ID: John Franco, male    DOB: [birth year]  Age: 72 y.o. MRN: [id number]  Chief Complaint  Patient presents with   Hypertension    HPI John Franco is a 72 y.o. male presenting today for follow up of hypertension, hyperlipidemia, prediabetes.  He also has acute complaints of a rash on the back of his legs, worsening hernia, and right knee pain.  He has a waxing and waning occasionally itchy bumpy rash on the backs of his calves.  He has had this for many months, it does not seem to be associated with any particular time of the year, changes in detergent/soap/lotion.  He states that it does not typically itch unless he scratches it first.  He has been putting an antifungal cream on it at home with minimal improvement.  He has had a hernia for several years, but has noticed that it does protrude more frequently now.  It is not painful and is still reducible, but he would like it evaluated by general surgery to assess whether or not it would be appropriate to surgically repair it.  He describes his right knee pain as "grindy" and has been going on for several years.  This has never been evaluated before. Hypertension: Patient here for follow-up of elevated blood pressure.  Pt denies chest pain, SOB, dizziness, edema, syncope, fatigue or heart palpitations. Taking valsartan-HCTZ, reports excellent compliance with treatment. Denies side effects. Hyperlipidemia: Currently managing with a low fat diet.  The 10-year ASCVD risk score (Arnett DK, et al., 2019) is: 22.6% Prediabetes: denies hypoglycemic events, wounds or sores that are not healing well, increased thirst or urination.   ROS Negative unless otherwise noted in HPI   Objective:     BP 120/70 (BP Location: Left Arm, Patient Position: Sitting, Cuff Size: Large)   Pulse 67   Resp 18   Ht 5\' 4"  (1.626 m)   Wt 204 lb (92.5 kg)   SpO2 95%   BMI 35.02 kg/m   Physical  Exam Constitutional:      General: He is not in acute distress.    Appearance: Normal appearance.  HENT:     Head: Normocephalic and atraumatic.  Cardiovascular:     Rate and Rhythm: Normal rate and regular rhythm.     Pulses: Normal pulses.     Heart sounds: Normal heart sounds. No murmur heard.    No friction rub. No gallop.  Pulmonary:     Effort: Pulmonary effort is normal.     Breath sounds: Normal breath sounds. No wheezing, rhonchi or rales.  Skin:    General: Skin is warm and dry.     Findings: Rash (Nonerythematous, nontender, nonpruritic 0.5 cm raised papules diffusely scattered across posterior calves bilaterally) present.  Neurological:     Mental Status: He is alert and oriented to person, place, and time.  Psychiatric:        Mood and Affect: Mood normal.     Assessment & Plan:  Primary hypertension Assessment & Plan: BP goal <130/80.  Stable, at goal.  Continue valsartan-hydrochlorothiazide 80-12.5 mg daily.  CMP within normal limits.  Will continue to monitor.  Orders: -     Valsartan-hydroCHLOROthiazide; Take 1 tablet by mouth daily.  Dispense: 90 tablet; Refill: 0  Elevated LDL cholesterol level Assessment & Plan: Last lipid panel: LDL 136, HDL 36, triglycerides 127.  Patient would prefer to wait to start medication, we discussed that ASCVD risk score  indicates that statin medication would be beneficial.  Will recheck lipid panel in 6 months, if ASCVD risk score still above 10% then recommend initiating statin therapy.  Patient will adhere to low-fat diet and increase physical activity until then.   Pre-diabetes Assessment & Plan: A1c 5.9, fairly stable and in the prediabetes category.  Recommend low carbohydrate/sugar nutrition and getting routine physical activity.  Will continue to monitor.   Unilateral inguinal hernia without obstruction or gangrene, recurrence not specified Assessment & Plan: Left inguinal hernia nontender and reducible, it is more  frequently protruding.  Sending referral to general surgery for evaluation and discussion of whether surgery is appropriate at this point or not.  Orders: -     Ambulatory referral to General Surgery  Chronic pain of right knee Assessment & Plan: Upon review of patient's chart, patient has discussed chronic knee pain with Wurtzel before.  During his appointment 1 year ago, Kandis Cocking discussed with him that the pain was most likely osteoarthritis and recommended conservative measures initially with recommendation to go to orthopedics if conservative measures did not suffice.  Imaging has not been done before, ordered knee x-ray to assess for OA related changes versus other abnormalities.  If OA confirmed, agree with plan for conservative measures with later referral to orthopedics.  Orders: -     DG Knee 1-2 Views Right  Screening for colorectal cancer -     Ambulatory referral to Gastroenterology  Rash and nonspecific skin eruption -     Triamcinolone Acetonide; Apply 1 Application topically 2 (two) times daily.  Dispense: 30 g; Refill: 1  Need for Tdap vaccination -     Tetanus-Diphth-Acell Pertussis; Inject 0.5 mLs into the muscle once for 1 dose.  Dispense: 0.5 mL; Refill: 0  Need for shingles vaccine -     Shingrix; Inject 0.5 mLs into the muscle once for 1 dose. Administer at 0 and 2-6 months for 2 total doses.  To be administered by pharmacy.  Dispense: 0.5 mL; Refill: 0  Need for pneumococcal vaccine -     Pneumococcal 20-Val Conj Vacc; Inject 0.5 mLs into the muscle tomorrow at 10 am for 1 dose.  Dispense: 0.5 mL; Refill: 0  Recommend mid potency steroid cream for rash.  If this does not alleviate symptoms, recommend referral to dermatology.  Discussed that he is due for Tdap, Shingrix, and PCV 20 vaccinations.  He can get those at his local pharmacy, patient verbalized understanding.  Return in about 6 months (around 06/01/2023) for follow-up for HTN, HLD, prediabetes, fasting  blood work 1 week before.    Melida Quitter, PA

## 2022-11-30 NOTE — Assessment & Plan Note (Signed)
Upon review of patient's chart, patient has discussed chronic knee pain with Wurtzel before.  During his appointment 1 year ago, Kandis Cocking discussed with him that the pain was most likely osteoarthritis and recommended conservative measures initially with recommendation to go to orthopedics if conservative measures did not suffice.  Imaging has not been done before, ordered knee x-ray to assess for OA related changes versus other abnormalities.  If OA confirmed, agree with plan for conservative measures with later referral to orthopedics.

## 2022-11-30 NOTE — Assessment & Plan Note (Addendum)
Last lipid panel: LDL 136, HDL 36, triglycerides 127.  Patient would prefer to wait to start medication, we discussed that ASCVD risk score indicates that statin medication would be beneficial.  Will recheck lipid panel in 6 months, if ASCVD risk score still above 10% then recommend initiating statin therapy.  Patient will adhere to low-fat diet and increase physical activity until then.

## 2022-11-30 NOTE — Assessment & Plan Note (Signed)
Left inguinal hernia nontender and reducible, it is more frequently protruding.  Sending referral to general surgery for evaluation and discussion of whether surgery is appropriate at this point or not.

## 2022-11-30 NOTE — Patient Instructions (Signed)
AT THE PHARMACY: -Tetanus booster -Shingles shots -PCV20 pneumonia vaccine

## 2022-11-30 NOTE — Assessment & Plan Note (Signed)
A1c 5.9, fairly stable and in the prediabetes category.  Recommend low carbohydrate/sugar nutrition and getting routine physical activity.  Will continue to monitor.

## 2022-11-30 NOTE — Assessment & Plan Note (Signed)
BP goal <130/80.  Stable, at goal.  Continue valsartan-hydrochlorothiazide 80-12.5 mg daily.  CMP within normal limits.  Will continue to monitor.

## 2022-12-06 ENCOUNTER — Encounter: Payer: Self-pay | Admitting: Surgery

## 2022-12-06 ENCOUNTER — Ambulatory Visit (INDEPENDENT_AMBULATORY_CARE_PROVIDER_SITE_OTHER): Payer: PPO | Admitting: Surgery

## 2022-12-06 VITALS — BP 153/80 | HR 77 | Temp 97.7°F | Ht 65.0 in | Wt 203.8 lb

## 2022-12-06 DIAGNOSIS — K429 Umbilical hernia without obstruction or gangrene: Secondary | ICD-10-CM | POA: Diagnosis not present

## 2022-12-06 DIAGNOSIS — K4090 Unilateral inguinal hernia, without obstruction or gangrene, not specified as recurrent: Secondary | ICD-10-CM | POA: Diagnosis not present

## 2022-12-06 DIAGNOSIS — M6208 Separation of muscle (nontraumatic), other site: Secondary | ICD-10-CM | POA: Diagnosis not present

## 2022-12-06 NOTE — Patient Instructions (Signed)
Our surgery scheduler John Franco will call you within 24-48 hours to get you scheduled. If you have not heard from her after 48 hours, please call our office. Have the blue sheet available when she calls to write down important information.   If you have any concerns or questions, please feel free to call our office.  Laparoscopic Inguinal Hernia Repair, Adult  Laparoscopic inguinal hernia repair is a surgical procedure to repair a small, weak spot in the groin muscles that allows fat or intestines from inside the abdomen to bulge out (inguinal hernia). This procedure may be planned, or it may be an emergency procedure. During the procedure, tissue that has bulged out is moved back into place, and the opening in the groin muscles is repaired. This is done through three small incisions in the abdomen. A thin tube with a light and camera on the end (laparoscope) is used to help perform the procedure. Tell a health care provider about: Any allergies you have. All medicines you are taking, including vitamins, herbs, eye drops, creams, and over-the-counter medicines. Any problems you or family members have had with anesthetic medicines. Any blood disorders you have. Any surgeries you have had. Any medical conditions you have. Whether you are pregnant or may be pregnant. What are the risks? Generally, this is a safe procedure. However, problems may occur, including: Infection. Bleeding. Allergic reactions to medicines. Damage to nearby structures or organs. Testicle damage or long-term pain and swelling of the scrotum, in males. Inability to completely empty the bladder (urinary retention). Blood clots. A collection of fluid that builds up under the skin (seroma). The hernia coming back (recurrence). What happens before the procedure? Staying hydrated Follow instructions from your health care provider about hydration, which may include: Up to 2 hours before the procedure - you may continue to  drink clear liquids, such as water, clear fruit juice, black coffee, and plain tea.  Eating and drinking restrictions Follow instructions from your health care provider about eating and drinking, which may include: 8 hours before the procedure - stop eating heavy meals or foods, such as meat, fried foods, or fatty foods. 6 hours before the procedure - stop eating light meals or foods, such as toast or cereal. 6 hours before the procedure - stop drinking milk or drinks that contain milk. 2 hours before the procedure - stop drinking clear liquids. Medicines Ask your health care provider about: Changing or stopping your regular medicines. This is especially important if you are taking diabetes medicines or blood thinners. Taking medicines such as aspirin and ibuprofen. These medicines can thin your blood. Do not take these medicines unless your health care provider tells you to take them. Taking over-the-counter medicines, vitamins, herbs, and supplements. General instructions Do not use any products that contain nicotine or tobacco for at least 4 weeks before the procedure, if possible. These products include cigarettes, chewing tobacco, and vaping devices, such as e-cigarettes. If you need help quitting, ask your health care provider. Ask your health care provider: How your surgery site will be marked. What steps will be taken to help prevent infection. These steps may include: Removing hair at the surgery site. Washing skin with a germ-killing soap. Taking antibiotic medicine. Plan to have a responsible adult take you home from the hospital or clinic. Plan to have a responsible adult care for you for the time you are told after you leave the hospital or clinic. This is important. What happens during the procedure? An IV will  be inserted into one of your veins. You will be given one or more of the following: A medicine to help you relax (sedative). A medicine to make you fall asleep  (general anesthetic). Three small incisions will be made in your abdomen. Your abdomen will be inflated with carbon dioxide gas to make the surgical area easier to see. A laparoscope and surgical instruments will be inserted through the incisions. The laparoscope will send images of the inside of your abdomen to a monitor in the room. Tissue that is bulging through the hernia may be removed or moved back into place. The hernia opening will be closed with a sheet of surgical mesh. The surgical instruments and laparoscope will be removed. Your incisions will be closed with stitches (sutures) and adhesive strips. A bandage (dressing) will be placed over your incisions. The procedure may vary among health care providers and hospitals. What happens after the procedure? Your blood pressure, heart rate, breathing rate, and blood oxygen level will be monitored until you leave the hospital or clinic. You will be given pain medicine as needed. You may continue to receive medicines and fluids through an IV. The IV will be removed after you can drink fluids. You will be encouraged to get up and move around and to take deep breaths frequently. If you were given a sedative during the procedure, it can affect you for several hours. Do not drive or operate machinery until your health care provider says that it is safe. Summary Laparoscopic inguinal hernia repair is a surgical procedure to repair a small, weak spot in the groin muscles that allows fat or intestines from inside the abdomen to bulge out (inguinal hernia). This procedure is done through three small incisions in the abdomen. A thin tube with a light and camera on the end (laparoscope) is used to help perform the procedure. After the procedure, you will be encouraged to get up and move around and to take deep breaths frequently. This information is not intended to replace advice given to you by your health care provider. Make sure you discuss any  questions you have with your health care provider. Document Revised: 02/03/2020 Document Reviewed: 02/03/2020 Elsevier Patient Education  2024 [employer]   Diastasis Recti  Diastasis recti is a condition in which the muscles of the abdomen (rectus abdominis muscles) become thin and separate. The result is a wider space between the muscles of the right and left abdomen (abdominal muscles). This wider space between the muscles may cause a bulge in the middle of the abdomen. This bulge may be noticed when a person is straining or when he or she sits up after lying down. Diastasis recti can affect men and women. It is most common among pregnant women, babies, people with obesity, and people who have had abdominal surgery. Exercise or surgery may help correct this condition. What are the causes? Common causes of this condition include: Pregnancy. As the uterus grows in size, it puts pressure on the abdominal muscles, causing the muscles to separate. Obesity. Excess fat puts pressure on abdominal muscles. Weight lifting. Some exercises of the abdomen. Advanced age. Genetics. Having had surgery on the abdomen before. What increases the risk? This condition is more likely to develop in: Women. Newborns, especially newborns who are born early (prematurely). What are the signs or symptoms? Common symptoms of this condition include: A bulge in the middle of your abdomen. You will notice it most when you sit up or strain. Pain in your  low back, hips, or the area between your hip bones (pelvis). Constipation. Being unable to control when you urinate (urinary incontinence). Bloating. Poor posture. How is this diagnosed? This condition is diagnosed with a physical exam. During the exam, your health care provider will ask you to lie flat on your back and do a crunch or half sit-up. If you have diastasis recti, a bulge will appear lengthwise between your abdominal muscles in the center of your  abdomen. Your health care provider will measure the gap between your muscles with one of the following: A medical device used to measure the space between two objects (caliper). A tape measure. CT scan. Ultrasound. Finger spaces. Your health care provider will measure the space using his or her fingers. How is this treated? If your muscle separation is not too large, you may not need treatment. However, if you are a woman who plans to become pregnant again, you should treat this condition before your next pregnancy. Treatment may include: Physical therapy exercises to strengthen and tighten your abdominal muscles. Lifestyle changes such as weight loss and exercise. Over-the-counter pain medicines as needed. Surgery to correct the separation. Follow these instructions at home: Activity Return to your normal activities as told by your health care provider. Ask your health care provider what activities are safe for you. Do exercises as told by your health care provider. Make sure you are doing your exercises and movements correctly when lifting weights or doing exercises using your abdominal muscles or the muscles in the center of your body that give stability (core muscles). Proper form can help to prevent this condition from happening again. General instructions If you are overweight, ask your health care provider for help with weight loss. Losing even a small amount of weight can help to improve your diastasis recti. Take over-the-counter or prescription medicines only as told by your health care provider. Do not strain. Straining can make the separation worse. Examples of straining include: Pushing hard to have a bowel movement, such as when you have constipation. Lifting heavy objects or lifting children. Standing up and sitting down. You may need to take these actions to prevent or treat constipation: Drink enough fluid to keep your urine pale yellow. Take over-the-counter or prescription  medicines. Eat foods that are high in fiber, such as beans, whole grains, and fresh fruits and vegetables. Limit foods that are high in fat and processed sugars, such as fried or sweet foods. Keep all follow-up visits. This is important. Contact a health care provider if: You notice a new bulge in your abdomen. Get help right away if: You experience severe discomfort in your abdomen. You develop severe abdominal pain along with nausea, vomiting, or a fever. Summary Diastasis recti is a condition in which the muscles of the abdomen (rectus abdominismuscles) become thin and separate. You may notice a bulge in your abdomen because the space has widened between the muscles of the right and left abdomen. The most common symptom is a bulge in the middle of your abdomen. You will notice it most when you sit up or strain. This condition is diagnosed with a physical exam. If the muscle separation is not too big, you may not need treatment. Otherwise, you may need to do physical therapy or have surgery. This information is not intended to replace advice given to you by your health care provider. Make sure you discuss any questions you have with your health care provider. Document Revised: 02/06/2020 Document Reviewed: 02/06/2020 Elsevier Patient  Education  2024 Elsevier Inc.  

## 2022-12-06 NOTE — Progress Notes (Signed)
12/06/2022  Reason for Visit: Left inguinal hernia  Requesting Provider: Saralyn Pilar, PA-C  History of Present Illness: John Franco is a 72 y.o. male presenting for evaluation of a left inguinal hernia.  The patient reports that he has had this for a few years but reports that over the last year he has been noticing more protrusion of the hernia itself.  He otherwise denies any symptoms in particular he denies any left groin pain, burning sensation, or overlying skin changes.  Denies any nausea, vomiting, constipation, or diarrhea.  He denies any symptoms in the right groin.  He does report that he has been told he has something in his umbilicus..  Past Medical History: Past Medical History:  Diagnosis Date   Hypertension      Past Surgical History: Past Surgical History:  Procedure Laterality Date   FOOT SURGERY      Home Medications: Prior to Admission medications   Medication Sig Start Date End Date Taking? Authorizing Provider  triamcinolone cream (KENALOG) 0.1 % Apply 1 Application topically 2 (two) times daily. 11/30/22  Yes Saralyn Pilar A, PA  valsartan-hydrochlorothiazide (DIOVAN-HCT) 80-12.5 MG tablet Take 1 tablet by mouth daily. 11/30/22  Yes Melida Quitter, PA    Allergies: Allergies  Allergen Reactions   Codeine Other (See Comments)    jittery    Social History:  reports that he quit smoking about 44 years ago. His smoking use included pipe. He has been exposed to tobacco smoke. He has never used smokeless tobacco. He reports current alcohol use. He reports that he does not use drugs.   Family History: Family History  Problem Relation Age of Onset   Arthritis Mother    Cancer Mother        breast   Heart attack Father    Cancer Brother        colon   Cancer Maternal Grandmother        breast   Pulmonary embolism Maternal Grandfather    Cancer Paternal Grandmother        ovarian   Cancer Paternal Grandfather        prostate    Review of  Systems: Review of Systems  Constitutional:  Negative for chills and fever.  HENT:  Negative for hearing loss.   Respiratory:  Negative for shortness of breath.   Cardiovascular:  Negative for chest pain.  Gastrointestinal:  Negative for abdominal pain, nausea and vomiting.  Genitourinary:  Negative for dysuria.  Musculoskeletal:  Positive for joint pain (right knee pain). Negative for myalgias.  Skin:  Negative for rash.  Neurological:  Negative for dizziness.  Psychiatric/Behavioral:  Negative for depression.     Physical Exam BP (!) 153/80   Pulse 77   Temp 97.7 F (36.5 C) (Oral)   Ht 5\' 5"  (1.651 m)   Wt 203 lb 12.8 oz (92.4 kg)   SpO2 96%   BMI 33.91 kg/m  CONSTITUTIONAL: No acute distress HEENT:  Normocephalic, atraumatic, extraocular motion intact. NECK: Trachea is midline, and there is no jugular venous distension.  RESPIRATORY:  Lungs are clear, and breath sounds are equal bilaterally. Normal respiratory effort without pathologic use of accessory muscles. CARDIOVASCULAR: Heart is regular without murmurs, gallops, or rubs. GI: The abdomen is soft, nondistended, nontender to palpation.  Patient has a reducible left inguinal hernia.  There is no hernia palpable on the right groin.  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  MUSCULOSKELETAL:  Normal muscle strength and tone in all four extremities.  No peripheral edema or cyanosis. SKIN: Skin turgor is normal. There are no pathologic skin lesions.  NEUROLOGIC:  Motor and sensation is grossly normal.  Cranial nerves are grossly intact. PSYCH:  Alert and oriented to person, place and time. Affect is normal.  Laboratory Analysis: Labs from 11/23/2022: Sodium 139, potassium 4.2, chloride 104, CO2 22, BUN 19, creatinine 1.0.  LFTs within normal limits.  WBC 6.6, hemoglobin 13.9, hematocrit 42.8, platelets 226.   Hemoglobin A1c 5.9.  Imaging: No results found.  Assessment and Plan: This is a 72 y.o. male with a left inguinal hernia, umbilical hernia, and diastases recti.  - Discussed with the patient the findings on exam today that indeed he does have a left inguinal hernia and also a very small umbilical hernia.  This last is in the setting of a diastases recti but there is no hernia defect within the diastases itself.  The patient currently is asymptomatic but I discussed with the patient the overall natural progression of hernias which can get bigger with time and potentially lead to symptoms as well.  The patient reports that his hernia has been getting bigger over the last year he notices that it bulges more and more frequently.  As such, discussed with him that we could help with inguinal hernia repair but given that he is asymptomatic, there is no specific rush in doing this.  Also unfortunately, there is no medical or exercise treatment plan that would be able to fix the hernia without surgery. -The patient reports that she is the primary caregiver for his wife who is wheelchair-bound.  However currently both of them are staying at his daughter's house and thinks it would be a more ideal time to have surgery so that he has somebody to help with his wife.  The patient will discuss with his family if he wants surgery or not.  Discussed with him that if he were to want to proceed with surgery, we will be doing a robotic left inguinal hernia repair and open umbilical hernia repair.  Reviewed with him the surgery at length including the planned incisions, risks of bleeding, infection, injury to surrounding structures, the ability to look at the right groin and fix the hernia if present, that this would be an outpatient procedure, postoperative activity restrictions, pain control.  He is aware that the diastases itself would not need any surgical management but we would repair the umbilical hernia at the time of  surgery.   -For now, the patient will think about it and decide whether he wants surgery now or not.  He will give [place] a call with his decision.  Otherwise, activity restrictions and precautions as well as the potential use for hernia belt or truss were explained to the patient.  All of their questions have been answered.  I spent 40 minutes dedicated to the care of this patient on the date of this encounter to include pre-visit review of records, face-to-face time with the patient discussing diagnosis and management, and any post-visit coordination of care.   Howie Ill, MD [facility]

## 2022-12-13 ENCOUNTER — Telehealth: Payer: Self-pay | Admitting: Surgery

## 2022-12-13 NOTE — Telephone Encounter (Signed)
Left message for patient to call, please inform him of the following regarding scheduled surgery with Dr. Aleen Campi.   Pre-Admission date/time, and Surgery date at [facility].  Surgery Date: 01/09/23 Preadmission Testing Date: 01/01/23 (phone 1p-4p)  Also patient will need to call at [phone], between 1-3:00pm the day before surgery, to find out what time to arrive for surgery.

## 2022-12-14 NOTE — Telephone Encounter (Signed)
Called patient again, he is now aware of all dates regarding his surgery.   

## 2022-12-18 DIAGNOSIS — K429 Umbilical hernia without obstruction or gangrene: Secondary | ICD-10-CM

## 2022-12-18 DIAGNOSIS — K4090 Unilateral inguinal hernia, without obstruction or gangrene, not specified as recurrent: Secondary | ICD-10-CM

## 2022-12-18 HISTORY — DX: Unilateral inguinal hernia, without obstruction or gangrene, not specified as recurrent: K40.90

## 2022-12-18 HISTORY — DX: Umbilical hernia without obstruction or gangrene: K42.9

## 2022-12-19 ENCOUNTER — Other Ambulatory Visit: Payer: PPO

## 2023-01-01 ENCOUNTER — Encounter
Admission: RE | Admit: 2023-01-01 | Discharge: 2023-01-01 | Disposition: A | Discharge status: Home / Self Care | Payer: PPO | Source: Ambulatory Visit | Attending: Surgery | Admitting: Surgery

## 2023-01-01 VITALS — Ht 65.0 in | Wt 205.0 lb

## 2023-01-01 DIAGNOSIS — Z01812 Encounter for preprocedural laboratory examination: Secondary | ICD-10-CM

## 2023-01-01 DIAGNOSIS — I10 Essential (primary) hypertension: Secondary | ICD-10-CM

## 2023-01-01 HISTORY — DX: Prediabetes: R73.03

## 2023-01-01 HISTORY — DX: Hyperlipidemia, unspecified: E78.5

## 2023-01-01 NOTE — Patient Instructions (Addendum)
Your procedure is scheduled on: Tuesday, July 23 Report to the Registration Desk on the 1st floor of the [facility] To find out your arrival time, please call [phone] between 1PM - 3PM on: Monday, July 22 If your arrival time is 6:00 am, do not arrive before that time as the [facility] entrance doors do not open until 6:00 am.  REMEMBER: Instructions that are not followed completely may result in serious medical risk, up to and including death; or upon the discretion of your surgeon and anesthesiologist your surgery may need to be rescheduled.  Do not eat food after midnight the night before surgery.  No gum chewing or hard candies.  You may however, drink CLEAR liquids up to 2 hours before you are scheduled to arrive for your surgery. Do not drink anything within 2 hours of your scheduled arrival time.  Clear liquids include: - water  - apple juice without pulp - gatorade (not RED colors) - black coffee or tea (Do NOT add milk or creamers to the coffee or tea) Do NOT drink anything that is not on this list.  One week prior to surgery: starting July 16 Stop Anti-inflammatories (NSAIDS) such as Advil, Aleve, Ibuprofen, Motrin, Naproxen, Naprosyn and Aspirin based products such as Excedrin, Goody's Powder, BC Powder. Stop ANY OVER THE COUNTER supplements until after surgery. You may however, continue to take Tylenol if needed for pain up until the day of surgery.  Continue taking all prescribed medications   DO NOT TAKE ANY MEDICATIONS THE MORNING OF SURGERY   No Alcohol for 24 hours before or after surgery.  No Smoking including e-cigarettes for 24 hours before surgery.  No chewable tobacco products for at least 6 hours before surgery.  No nicotine patches on the day of surgery.  Do not use any "recreational" drugs for at least a week (preferably 2 weeks) before your surgery.  Please be advised that the combination of cocaine and anesthesia may have negative outcomes,  up to and including death. If you test positive for cocaine, your surgery will be cancelled.  On the morning of surgery brush your teeth with toothpaste and water, you may rinse your mouth with mouthwash if you wish. Do not swallow any toothpaste or mouthwash.  Use CHG Soap as directed on instruction sheet.  Do not wear jewelry, make-up, hairpins, clips or nail polish.  Do not wear lotions, powders, or perfumes.   Do not shave body hair from the neck down 48 hours before surgery.  Contact lenses, hearing aids and dentures may not be worn into surgery.  Do not bring valuables to the hospital. [facility] is not responsible for any missing/lost belongings or valuables.   Notify your doctor if there is any change in your medical condition (cold, fever, infection).  Wear comfortable clothing (specific to your surgery type) to the hospital.  After surgery, you can help prevent lung complications by doing breathing exercises.  Take deep breaths and cough every 1-2 hours. Your doctor may order a device called an Incentive Spirometer to help you take deep breaths. When coughing or sneezing, hold a pillow firmly against your incision with both hands. This is called "splinting." Doing this helps protect your incision. It also decreases belly discomfort.  If you are being discharged the day of surgery, you will not be allowed to drive home. You will need a responsible individual to drive you home and stay with you for 24 hours after surgery.   If you  are taking public transportation, you will need to have a responsible individual with you.  Please call the Pre-admissions Testing Dept. at [phone] if you have any questions about these instructions.  Surgery Visitation Policy:  Patients having surgery or a procedure may have two visitors.  Children under the age of 70 must have an adult with them who is not the patient.     Preparing for Surgery with CHLORHEXIDINE GLUCONATE (CHG)  Soap  Chlorhexidine Gluconate (CHG) Soap  o An antiseptic cleaner that kills germs and bonds with the skin to continue killing germs even after washing  o Used for showering the night before surgery and morning of surgery  Before surgery, you can play an important role by reducing the number of germs on your skin.  CHG (Chlorhexidine gluconate) soap is an antiseptic cleanser which kills germs and bonds with the skin to continue killing germs even after washing.  Please do not use if you have an allergy to CHG or antibacterial soaps. If your skin becomes reddened/irritated stop using the CHG.  1. Shower the NIGHT BEFORE SURGERY and the MORNING OF SURGERY with CHG soap.  2. If you choose to wash your hair, wash your hair first as usual with your normal shampoo.  3. After shampooing, rinse your hair and body thoroughly to remove the shampoo.  4. Use CHG as you would any other liquid soap. You can apply CHG directly to the skin and wash gently with a scrungie or a clean washcloth.  5. Apply the CHG soap to your body only from the neck down. Do not use on open wounds or open sores. Avoid contact with your eyes, ears, mouth, and genitals (private parts). Wash face and genitals (private parts) with your normal soap.  6. Wash thoroughly, paying special attention to the area where your surgery will be performed.  7. Thoroughly rinse your body with warm water.  8. Do not shower/wash with your normal soap after using and rinsing off the CHG soap.  9. Pat yourself dry with a clean towel.  10. Wear clean pajamas to bed the night before surgery.  12. Place clean sheets on your bed the night of your first shower and do not sleep with pets.  13. Shower again with the CHG soap on the day of surgery prior to arriving at the hospital.  14. Do not apply any deodorants/lotions/powders.  15. Please wear clean clothes to the hospital.

## 2023-01-03 ENCOUNTER — Encounter
Admission: RE | Admit: 2023-01-03 | Discharge: 2023-01-03 | Disposition: A | Discharge status: Home / Self Care | Payer: PPO | Source: Ambulatory Visit | Attending: Surgery | Admitting: Surgery

## 2023-01-03 DIAGNOSIS — I10 Essential (primary) hypertension: Secondary | ICD-10-CM | POA: Diagnosis not present

## 2023-01-03 DIAGNOSIS — Z01810 Encounter for preprocedural cardiovascular examination: Secondary | ICD-10-CM | POA: Diagnosis not present

## 2023-01-03 DIAGNOSIS — Z01812 Encounter for preprocedural laboratory examination: Secondary | ICD-10-CM

## 2023-01-03 DIAGNOSIS — Z01818 Encounter for other preprocedural examination: Secondary | ICD-10-CM | POA: Diagnosis not present

## 2023-01-03 LAB — BASIC METABOLIC PANEL
Anion gap: 6 (ref 5–15)
BUN: 26 mg/dL — ABNORMAL HIGH (ref 8–23)
CO2: 25 mmol/L (ref 22–32)
Calcium: 9 mg/dL (ref 8.9–10.3)
Chloride: 104 mmol/L (ref 98–111)
Creatinine, Ser: 1.18 mg/dL (ref 0.61–1.24)
GFR, Estimated: >60 mL/min (ref >60)
Glucose, Bld: 97 mg/dL (ref 70–99)
Potassium: 4.1 mmol/L (ref 3.5–5.1)
Sodium: 135 mmol/L (ref 135–145)

## 2023-01-09 ENCOUNTER — Other Ambulatory Visit: Payer: Self-pay

## 2023-01-09 ENCOUNTER — Ambulatory Visit: Payer: PPO | Admitting: Certified Registered"

## 2023-01-09 ENCOUNTER — Ambulatory Visit: Payer: PPO | Admitting: Urgent Care

## 2023-01-09 ENCOUNTER — Encounter
Admission: RE | Disposition: A | Discharge status: Home / Self Care | Payer: Self-pay | Source: Home / Self Care | Attending: Surgery

## 2023-01-09 ENCOUNTER — Ambulatory Visit
Admission: RE | Admit: 2023-01-09 | Discharge: 2023-01-09 | Disposition: A | Discharge status: Home / Self Care | Payer: PPO | Attending: Surgery | Admitting: Surgery

## 2023-01-09 ENCOUNTER — Encounter: Payer: Self-pay | Admitting: Surgery

## 2023-01-09 DIAGNOSIS — Z87891 Personal history of nicotine dependence: Secondary | ICD-10-CM | POA: Diagnosis not present

## 2023-01-09 DIAGNOSIS — K429 Umbilical hernia without obstruction or gangrene: Secondary | ICD-10-CM

## 2023-01-09 DIAGNOSIS — K4020 Bilateral inguinal hernia, without obstruction or gangrene, not specified as recurrent: Secondary | ICD-10-CM

## 2023-01-09 DIAGNOSIS — K4090 Unilateral inguinal hernia, without obstruction or gangrene, not specified as recurrent: Secondary | ICD-10-CM | POA: Diagnosis not present

## 2023-01-09 DIAGNOSIS — Z8249 Family history of ischemic heart disease and other diseases of the circulatory system: Secondary | ICD-10-CM | POA: Diagnosis not present

## 2023-01-09 DIAGNOSIS — I10 Essential (primary) hypertension: Secondary | ICD-10-CM | POA: Insufficient documentation

## 2023-01-09 HISTORY — DX: Umbilical hernia without obstruction or gangrene: K42.9

## 2023-01-09 HISTORY — PX: XI ROBOTIC ASSISTED INGUINAL HERNIA REPAIR WITH MESH: SHX6706

## 2023-01-09 HISTORY — DX: Gastro-esophageal reflux disease without esophagitis: K21.9

## 2023-01-09 HISTORY — DX: Bilateral inguinal hernia, without obstruction or gangrene, not specified as recurrent: K40.20

## 2023-01-09 HISTORY — PX: UMBILICAL HERNIA REPAIR: SHX196

## 2023-01-09 SURGERY — REPAIR, HERNIA, INGUINAL, ROBOT-ASSISTED, LAPAROSCOPIC, USING MESH
Anesthesia: General | Site: Inguinal

## 2023-01-09 MED ORDER — LIDOCAINE HCL (CARDIAC) PF 100 MG/5ML IV SOSY
PREFILLED_SYRINGE | INTRAVENOUS | Status: DC | PRN
  Administered 2023-01-09: 100 mg via INTRAVENOUS

## 2023-01-09 MED ORDER — BUPIVACAINE LIPOSOME 1.3 % IJ SUSP
INTRAMUSCULAR | Status: DC | PRN
  Administered 2023-01-09: 20 mL

## 2023-01-09 MED ORDER — CEFAZOLIN SODIUM-DEXTROSE 2-4 GM/100ML-% IV SOLN
2.0000 g | INTRAVENOUS | Status: AC
  Administered 2023-01-09: 2 g via INTRAVENOUS

## 2023-01-09 MED ORDER — IBUPROFEN 600 MG PO TABS: 600.0000 mg | ORAL_TABLET | Freq: Three times a day (TID) | ORAL | 1 refills | Status: AC | PRN

## 2023-01-09 MED ORDER — ACETAMINOPHEN 500 MG PO TABS: 1000.0000 mg | ORAL_TABLET | Freq: Four times a day (QID) | ORAL | Status: DC | PRN

## 2023-01-09 MED ORDER — BUPIVACAINE LIPOSOME 1.3 % IJ SUSP
INTRAMUSCULAR | Status: AC
  Filled 2023-01-09: qty 20

## 2023-01-09 MED ORDER — KETAMINE HCL 10 MG/ML IJ SOLN
INTRAMUSCULAR | Status: DC | PRN
  Administered 2023-01-09: 30 mg via INTRAVENOUS
  Administered 2023-01-09 (×2): 10 mg via INTRAVENOUS

## 2023-01-09 MED ORDER — KETOROLAC TROMETHAMINE 30 MG/ML IJ SOLN
INTRAMUSCULAR | Status: DC | PRN
  Administered 2023-01-09: 30 mg via INTRAVENOUS

## 2023-01-09 MED ORDER — FENTANYL CITRATE (PF) 100 MCG/2ML IJ SOLN
INTRAMUSCULAR | Status: AC
  Filled 2023-01-09: qty 2

## 2023-01-09 MED ORDER — EPHEDRINE SULFATE (PRESSORS) 50 MG/ML IJ SOLN
INTRAMUSCULAR | Status: DC | PRN
  Administered 2023-01-09: 5 mg via INTRAVENOUS

## 2023-01-09 MED ORDER — ONDANSETRON HCL 4 MG/2ML IJ SOLN
INTRAMUSCULAR | Status: AC
  Filled 2023-01-09: qty 2

## 2023-01-09 MED ORDER — FAMOTIDINE 20 MG PO TABS
ORAL_TABLET | ORAL | Status: AC
  Filled 2023-01-09: qty 1

## 2023-01-09 MED ORDER — SUGAMMADEX SODIUM 200 MG/2ML IV SOLN
INTRAVENOUS | Status: DC | PRN
  Administered 2023-01-09: 200 mg via INTRAVENOUS

## 2023-01-09 MED ORDER — ONDANSETRON HCL 4 MG/2ML IJ SOLN
INTRAMUSCULAR | Status: DC | PRN
  Administered 2023-01-09: 4 mg via INTRAVENOUS

## 2023-01-09 MED ORDER — GABAPENTIN 300 MG PO CAPS
ORAL_CAPSULE | ORAL | Status: AC
  Filled 2023-01-09: qty 1

## 2023-01-09 MED ORDER — OXYCODONE HCL 5 MG PO TABS: 5.0000 mg | ORAL_TABLET | ORAL | 0 refills | Status: DC | PRN

## 2023-01-09 MED ORDER — OXYCODONE HCL 5 MG PO TABS: 5.0000 mg | ORAL_TABLET | Freq: Once | ORAL | Status: DC | PRN

## 2023-01-09 MED ORDER — CHLORHEXIDINE GLUCONATE CLOTH 2 % EX PADS: 6.0000 | MEDICATED_PAD | Freq: Once | CUTANEOUS | Status: DC

## 2023-01-09 MED ORDER — ACETAMINOPHEN 500 MG PO TABS
1000.0000 mg | ORAL_TABLET | ORAL | Status: AC
  Administered 2023-01-09: 1000 mg via ORAL

## 2023-01-09 MED ORDER — ONDANSETRON HCL 4 MG/2ML IJ SOLN
4.0000 mg | Freq: Once | INTRAMUSCULAR | Status: AC
  Administered 2023-01-09: 4 mg via INTRAVENOUS

## 2023-01-09 MED ORDER — FAMOTIDINE 20 MG PO TABS
20.0000 mg | ORAL_TABLET | Freq: Once | ORAL | Status: AC
  Administered 2023-01-09: 20 mg via ORAL

## 2023-01-09 MED ORDER — BUPIVACAINE LIPOSOME 1.3 % IJ SUSP: 20.0000 mL | Freq: Once | INTRAMUSCULAR | Status: DC

## 2023-01-09 MED ORDER — LACTATED RINGERS IV SOLN: INTRAVENOUS | Status: DC

## 2023-01-09 MED ORDER — BUPIVACAINE HCL (PF) 0.5 % IJ SOLN
INTRAMUSCULAR | Status: AC
  Filled 2023-01-09: qty 30

## 2023-01-09 MED ORDER — ROCURONIUM BROMIDE 100 MG/10ML IV SOLN
INTRAVENOUS | Status: DC | PRN
  Administered 2023-01-09: 50 mg via INTRAVENOUS
  Administered 2023-01-09 (×2): 10 mg via INTRAVENOUS

## 2023-01-09 MED ORDER — ORAL CARE MOUTH RINSE: 15.0000 mL | Freq: Once | OROMUCOSAL | Status: AC

## 2023-01-09 MED ORDER — EPINEPHRINE PF 1 MG/ML IJ SOLN
INTRAMUSCULAR | Status: AC
  Filled 2023-01-09: qty 1

## 2023-01-09 MED ORDER — HYDROMORPHONE HCL 1 MG/ML IJ SOLN: 0.2500 mg | INTRAMUSCULAR | Status: DC | PRN

## 2023-01-09 MED ORDER — CEFAZOLIN SODIUM-DEXTROSE 2-4 GM/100ML-% IV SOLN
INTRAVENOUS | Status: AC
  Filled 2023-01-09: qty 100

## 2023-01-09 MED ORDER — CHLORHEXIDINE GLUCONATE 0.12 % MT SOLN
OROMUCOSAL | Status: AC
  Filled 2023-01-09: qty 15

## 2023-01-09 MED ORDER — BUPIVACAINE-EPINEPHRINE (PF) 0.5% -1:200000 IJ SOLN
INTRAMUSCULAR | Status: DC | PRN
  Administered 2023-01-09: 30 mL

## 2023-01-09 MED ORDER — DEXAMETHASONE SODIUM PHOSPHATE 10 MG/ML IJ SOLN
INTRAMUSCULAR | Status: DC | PRN
  Administered 2023-01-09: 10 mg via INTRAVENOUS

## 2023-01-09 MED ORDER — PROPOFOL 10 MG/ML IV BOLUS
INTRAVENOUS | Status: DC | PRN
Start: 2023-01-09 — End: 2023-01-09
  Administered 2023-01-09: 150 mg via INTRAVENOUS

## 2023-01-09 MED ORDER — OXYCODONE HCL 5 MG/5ML PO SOLN: 5.0000 mg | Freq: Once | ORAL | Status: DC | PRN

## 2023-01-09 MED ORDER — ACETAMINOPHEN 500 MG PO TABS
ORAL_TABLET | ORAL | Status: AC
  Filled 2023-01-09: qty 2

## 2023-01-09 MED ORDER — FENTANYL CITRATE (PF) 100 MCG/2ML IJ SOLN
INTRAMUSCULAR | Status: DC | PRN
  Administered 2023-01-09: 100 ug via INTRAVENOUS

## 2023-01-09 MED ORDER — CHLORHEXIDINE GLUCONATE 0.12 % MT SOLN
15.0000 mL | Freq: Once | OROMUCOSAL | Status: AC
  Administered 2023-01-09: 15 mL via OROMUCOSAL

## 2023-01-09 MED ORDER — KETAMINE HCL 50 MG/5ML IJ SOSY
PREFILLED_SYRINGE | INTRAMUSCULAR | Status: AC
  Filled 2023-01-09: qty 5

## 2023-01-09 MED ORDER — GABAPENTIN 300 MG PO CAPS
300.0000 mg | ORAL_CAPSULE | ORAL | Status: AC
  Administered 2023-01-09: 300 mg via ORAL

## 2023-01-09 SURGICAL SUPPLY — 54 items
ADH SKN CLS APL DERMABOND .7 (GAUZE/BANDAGES/DRESSINGS) ×3
BLADE SURG 15 STRL LF DISP TIS (BLADE) ×4 IMPLANT
BLADE SURG 15 STRL SS (BLADE) ×3
COVER TIP SHEARS 8 DVNC (MISCELLANEOUS) ×4 IMPLANT
COVER WAND RF STERILE (DRAPES) ×4 IMPLANT
DERMABOND ADVANCED .7 DNX12 (GAUZE/BANDAGES/DRESSINGS) ×4 IMPLANT
DRAPE ARM DVNC X/XI (DISPOSABLE) ×12 IMPLANT
DRAPE COLUMN DVNC XI (DISPOSABLE) ×4 IMPLANT
DRAPE LAPAROTOMY 77X122 PED (DRAPES) ×4 IMPLANT
ELECT CAUTERY BLADE TIP 2.5 (TIP) ×3
ELECT REM PT RETURN 9FT ADLT (ELECTROSURGICAL) ×3
ELECTRODE CAUTERY BLDE TIP 2.5 (TIP) ×4 IMPLANT
ELECTRODE REM PT RTRN 9FT ADLT (ELECTROSURGICAL) ×4 IMPLANT
FORCEPS BPLR R/ABLATION 8 DVNC (INSTRUMENTS) ×4 IMPLANT
GLOVE SURG SYN 7.0 (GLOVE) ×6 IMPLANT
GLOVE SURG SYN 7.0 PF PI (GLOVE) ×6 IMPLANT
GLOVE SURG SYN 7.5 E (GLOVE) ×6 IMPLANT
GLOVE SURG SYN 7.5 PF PI (GLOVE) ×6 IMPLANT
GOWN STRL REUS W/ TWL LRG LVL3 (GOWN DISPOSABLE) ×18 IMPLANT
GOWN STRL REUS W/TWL LRG LVL3 (GOWN DISPOSABLE) ×18
KIT PINK PAD W/HEAD ARE REST (MISCELLANEOUS) ×3
KIT PINK PAD W/HEAD ARM REST (MISCELLANEOUS) ×4 IMPLANT
LABEL OR SOLS (LABEL) ×4 IMPLANT
MANIFOLD NEPTUNE II (INSTRUMENTS) ×4 IMPLANT
MESH 3DMAX MID 4X6 LT LRG (Mesh General) ×1 IMPLANT
MESH 3DMAX MID 4X6 RT LRG (Mesh General) ×1 IMPLANT
NDL DRIVE SUT CUT DVNC (INSTRUMENTS) ×3 IMPLANT
NDL HYPO 22X1.5 SAFETY MO (MISCELLANEOUS) ×3 IMPLANT
NDL INSUFFLATION 14GA 120MM (NEEDLE) ×3 IMPLANT
NEEDLE DRIVE SUT CUT DVNC (INSTRUMENTS) ×3 IMPLANT
NEEDLE HYPO 22X1.5 SAFETY MO (MISCELLANEOUS) ×3 IMPLANT
NEEDLE INSUFFLATION 14GA 120MM (NEEDLE) ×3 IMPLANT
NS IRRIG 500ML POUR BTL (IV SOLUTION) ×4 IMPLANT
OBTURATOR OPTICAL STND 8 DVNC (TROCAR) ×3
OBTURATOR OPTICALSTD 8 DVNC (TROCAR) ×4 IMPLANT
PACK BASIN MINOR ARMC (MISCELLANEOUS) ×4 IMPLANT
PACK LAP CHOLECYSTECTOMY (MISCELLANEOUS) ×4 IMPLANT
PENCIL SMOKE EVACUATOR (MISCELLANEOUS) ×4 IMPLANT
SCISSORS MNPLR CVD DVNC XI (INSTRUMENTS) ×4 IMPLANT
SEAL UNIV 5-12 XI (MISCELLANEOUS) ×12 IMPLANT
SET TUBE SMOKE EVAC HIGH FLOW (TUBING) ×4 IMPLANT
SOL ELECTROSURG ANTI STICK (MISCELLANEOUS) ×3
SOLUTION ELECTROSURG ANTI STCK (MISCELLANEOUS) ×4 IMPLANT
SUT MNCRL AB 4-0 PS2 18 (SUTURE) ×5 IMPLANT
SUT VIC AB 2-0 SH 27 (SUTURE) ×6
SUT VIC AB 2-0 SH 27XBRD (SUTURE) ×8 IMPLANT
SUT VIC AB 3-0 SH 27 (SUTURE) ×3
SUT VIC AB 3-0 SH 27X BRD (SUTURE) ×4 IMPLANT
SUT VICRYL 0 UR6 27IN ABS (SUTURE) ×7 IMPLANT
SUT VLOC 90 S/L VL9 GS22 (SUTURE) ×5 IMPLANT
SYR 20ML LL LF (SYRINGE) ×4 IMPLANT
TAPE TRANSPORE STRL 2 31045 (GAUZE/BANDAGES/DRESSINGS) ×4 IMPLANT
TRAP FLUID SMOKE EVACUATOR (MISCELLANEOUS) ×4 IMPLANT
TRAY FOLEY SLVR 16FR LF STAT (SET/KITS/TRAYS/PACK) ×4 IMPLANT

## 2023-01-09 NOTE — Transfer of Care (Signed)
Immediate Anesthesia Transfer of Care Note  Patient: John Franco  Procedure(s) Performed: XI ROBOTIC ASSISTED INGUINAL HERNIA REPAIR WITH MESH (Bilateral: Inguinal) HERNIA REPAIR UMBILICAL ADULT (Abdomen)  Patient Location: PACU  Anesthesia Type:General  Level of Consciousness: awake and drowsy  Airway & Oxygen Therapy: Patient Spontanous Breathing and Patient connected to face mask oxygen  Post-op Assessment: Report given to RN and Post -op Vital signs reviewed and stable  Post vital signs: Reviewed and stable  Last Vitals:  Vitals Value Taken Time  BP 126/67 01/09/23 1235  Temp    Pulse 76 01/09/23 1239  Resp 17 01/09/23 1239  SpO2 99 % 01/09/23 1239  Vitals shown include unfiled device data.  Last Pain:  Vitals:   01/09/23 0902  TempSrc: Tympanic  PainSc: 0-No pain         Complications: No notable events documented.

## 2023-01-09 NOTE — H&P (Signed)
Reason for Visit: Left inguinal hernia   Requesting Provider: Saralyn Pilar, PA-C   History of Present Illness: John Franco is a 72 y.o. male presenting for evaluation of a left inguinal hernia.  The patient reports that he has had this for a few years but reports that over the last year he has been noticing more protrusion of the hernia itself.  He otherwise denies any symptoms in particular he denies any left groin pain, burning sensation, or overlying skin changes.  Denies any nausea, vomiting, constipation, or diarrhea.  He denies any symptoms in the right groin.  He does report that he has been told he has something in his umbilicus..   Past Medical History:     Past Medical History:  Diagnosis Date   Hypertension            Past Surgical History:      Past Surgical History:  Procedure Laterality Date   FOOT SURGERY              Home Medications:        Prior to Admission medications   Medication Sig Start Date End Date Taking? Authorizing Provider  triamcinolone cream (KENALOG) 0.1 % Apply 1 Application topically 2 (two) times daily. 11/30/22   Yes Saralyn Pilar A, PA  valsartan-hydrochlorothiazide (DIOVAN-HCT) 80-12.5 MG tablet Take 1 tablet by mouth daily. 11/30/22   Yes Melida Quitter, PA      Allergies: Allergies       Allergies  Allergen Reactions   Codeine Other (See Comments)      jittery        Social History:  reports that he quit smoking about 44 years ago. His smoking use included pipe. He has been exposed to tobacco smoke. He has never used smokeless tobacco. He reports current alcohol use. He reports that he does not use drugs.    Family History:      Family History  Problem Relation Age of Onset   Arthritis Mother     Cancer Mother          breast   Heart attack Father     Cancer Brother          colon   Cancer Maternal Grandmother          breast   Pulmonary embolism Maternal Grandfather     Cancer Paternal Grandmother           ovarian   Cancer Paternal Grandfather          prostate          Review of Systems: Review of Systems  Constitutional:  Negative for chills and fever.  HENT:  Negative for hearing loss.   Respiratory:  Negative for shortness of breath.   Cardiovascular:  Negative for chest pain.  Gastrointestinal:  Negative for abdominal pain, nausea and vomiting.  Genitourinary:  Negative for dysuria.  Musculoskeletal:  Positive for joint pain (right knee pain). Negative for myalgias.  Skin:  Negative for rash.  Neurological:  Negative for dizziness.  Psychiatric/Behavioral:  Negative for depression.       Physical Exam BP (!) 153/80   Pulse 77   Temp 97.7 F (36.5 C) (Oral)   Ht 5\' 5"  (1.651 m)   Wt 203 lb 12.8 oz (92.4 kg)   SpO2 96%   BMI 33.91 kg/m  CONSTITUTIONAL: No acute distress HEENT:  Normocephalic, atraumatic, extraocular motion intact. NECK: Trachea is midline, and there is no jugular venous distension.  RESPIRATORY:  Lungs are clear, and breath sounds are equal bilaterally. Normal respiratory effort without pathologic use of accessory muscles. CARDIOVASCULAR: Heart is regular without murmurs, gallops, or rubs. GI: The abdomen is soft, nondistended, nontender to palpation.  Patient has a reducible left inguinal hernia.  There is no hernia palpable on the right groin.  He also has a very small umbilical hernia, likely less than 1 cm in size, in association with diastases recti with significant separation of the rectus muscles.  MUSCULOSKELETAL:  Normal muscle strength and tone in all four extremities.  No peripheral edema or cyanosis. SKIN: Skin turgor is normal. There are no pathologic skin lesions.  NEUROLOGIC:  Motor and sensation is grossly normal.  Cranial nerves are grossly intact. PSYCH:  Alert and oriented to person, place and time. Affect is normal.   Laboratory Analysis: Labs from 11/23/2022: Sodium 139, potassium 4.2, chloride 104, CO2 22, BUN 19, creatinine 1.0.  LFTs  within normal limits.  WBC 6.6, hemoglobin 13.9, hematocrit 42.8, platelets 226.  Hemoglobin A1c 5.9.   Imaging: No results found.   Assessment and Plan: This is a 72 y.o. male with a left inguinal hernia, umbilical hernia, and diastases recti.   - Discussed with the patient the findings on exam today that indeed he does have a left inguinal hernia and also a very small umbilical hernia.  This last is in the setting of a diastases recti but there is no hernia defect within the diastases itself.  The patient currently is asymptomatic but I discussed with the patient the overall natural progression of hernias which can get bigger with time and potentially lead to symptoms as well.  The patient reports that his hernia has been getting bigger over the last year he notices that it bulges more and more frequently.  As such, discussed with him that we could help with inguinal hernia repair but given that he is asymptomatic, there is no specific rush in doing this.  Also unfortunately, there is no medical or exercise treatment plan that would be able to fix the hernia without surgery. -The patient reports that she is the primary caregiver for his wife who is wheelchair-bound.  However currently both of them are staying at his daughter's house and thinks it would be a more ideal time to have surgery so that he has somebody to help with his wife.  The patient will discuss with his family if he wants surgery or not.  Discussed with him that if he were to want to proceed with surgery, we will be doing a robotic left inguinal hernia repair and open umbilical hernia repair.  Reviewed with him the surgery at length including the planned incisions, risks of bleeding, infection, injury to surrounding structures, the ability to look at the right groin and fix the hernia if present, that this would be an outpatient procedure, postoperative activity restrictions, pain control.  He is aware that the diastases itself would not  need any surgical management but we would repair the umbilical hernia at the time of surgery.   --Patient is scheduled for surgery on 01/09/23.     Howie Ill, MD [facility]

## 2023-01-09 NOTE — Anesthesia Preprocedure Evaluation (Addendum)
Anesthesia Evaluation  Patient identified by MRN, date of birth, ID band Patient awake    Reviewed: Allergy & Precautions, NPO status , Patient's Chart, lab work & pertinent test results  History of Anesthesia Complications Negative for: history of anesthetic complications  Airway Mallampati: III  TM Distance: >3 FB Neck ROM: full    Dental  (+) Poor Dentition   Pulmonary former smoker   Pulmonary exam normal        Cardiovascular hypertension, Normal cardiovascular exam     Neuro/Psych negative neurological ROS  negative psych ROS   GI/Hepatic Neg liver ROS,GERD  Controlled,,  Endo/Other  negative endocrine ROS    Renal/GU negative Renal ROS     Musculoskeletal   Abdominal   Peds  Hematology negative hematology ROS (+)   Anesthesia Other Findings Past Medical History: No date: GERD (gastroesophageal reflux disease) No date: Hyperlipidemia No date: Hypertension 12/2022: Left inguinal hernia No date: Pre-diabetes 12/2022: Umbilical hernia  Past Surgical History: 1987: FOOT SURGERY; Left     Comment:  heel fracture No date: HARDWARE REMOVAL; Left     Comment:  heel  BMI    Body Mass Index: 34.11 kg/m      Reproductive/Obstetrics negative OB ROS                             Anesthesia Physical Anesthesia Plan  ASA: 2  Anesthesia Plan: General ETT   Post-op Pain Management: Toradol IV (intra-op)*, Ofirmev IV (intra-op)* and Ketamine IV*   Induction: Intravenous  PONV Risk Score and Plan: 2 and Ondansetron, Dexamethasone and Treatment may vary due to age or medical condition  Airway Management Planned: Oral ETT  Additional Equipment:   Intra-op Plan:   Post-operative Plan: Extubation in OR  Informed Consent: I have reviewed the patients History and Physical, chart, labs and discussed the procedure including the risks, benefits and alternatives for the proposed  anesthesia with the patient or authorized representative who has indicated his/her understanding and acceptance.     Dental Advisory Given  Plan Discussed with: Anesthesiologist, CRNA and Surgeon  Anesthesia Plan Comments: (Patient consented for risks of anesthesia including but not limited to:  - adverse reactions to medications - damage to eyes, teeth, lips or other oral mucosa - nerve damage due to positioning  - sore throat or hoarseness - Damage to heart, brain, nerves, lungs, other parts of body or loss of life  Patient voiced understanding.)        Anesthesia Quick Evaluation

## 2023-01-09 NOTE — Discharge Instructions (Addendum)
Discharge Instructions: 1.  Patient may shower, but do not scrub wounds heavily and dab dry only. 2.  Do not submerge wounds in pool/tub until fully healed. 3.  Do not apply ointments or hydrogen peroxide to the wounds. 4.  May apply ice packs to the wounds for comfort. 5.  There is swelling in both groin/scrotum areas from insufflation during surgery.  This will reabsorb on its own. 6.  It is normal for there to be swelling and bruising at the groin areas as well.  This will resolve on its own. 7.  May wear a compression/sports underwear to help with decreasing the puffiness or swelling in the groin areas. 8.  Do not drive while taking narcotics for pain control.  Prior to driving, make sure you are able to rotate right and left to look at blindspots without significant pain or discomfort. 9.  No heavy lifting or pushing of more than 10-15 lbs for 4 weeks.   AMBULATORY SURGERY  DISCHARGE INSTRUCTIONS   The drugs that you were given will stay in your system until tomorrow so for the next 24 hours you should not:  Drive an automobile Make any legal decisions Drink any alcoholic beverage   You may resume regular meals tomorrow.  Today it is better to start with liquids and gradually work up to solid foods.  You may eat anything you prefer, but it is better to start with liquids, then soup and crackers, and gradually work up to solid foods.   Please notify your doctor immediately if you have any unusual bleeding, trouble breathing, redness and pain at the surgery site, drainage, fever, or pain not relieved by medication.    Additional Instructions:  Please contact your physician with any problems or Same Day Surgery at [phone], Monday through Friday 6 am to 4 pm, or [place] at [facility] number at [phone].

## 2023-01-09 NOTE — Op Note (Addendum)
Procedure Date:  01/09/2023  Pre-operative Diagnosis:  Left inguinal hernia and umbilical hernia  Post-operative Diagnosis: Bilateral direct inguinal hernia and umbilical hernia 1 cm.  Procedure: 1.  Robotic assisted Bilateral Inguinal Hernia Repair 2.  Creation of Bilateral Posterior Rectus-Transversalis Fascia Advancment Flap for Coverage of Pelvic Wound (200 cm) 3.  Open umbilical hernia repair  Surgeon:  Howie Ill, MD  Assistant:  Rosezena Sensor, PA-S  Anesthesia:  General endotracheal  Estimated Blood Loss:  20 ml  Specimens:  None  Complications:  None  Indications for Procedure:  This is a 72 y.o. male who presents with a left inguinal hernia and umbilical hernia.  The options of surgery versus observation were reviewed with the patient and/or family. The risks of bleeding, abscess or infection, recurrence of symptoms, potential for an open procedure, injury to surrounding structures, and chronic pain were all discussed with the patient and he was willing to proceed.  We have planned this transabdominal procedure with the creation of a right peritoneal flap based on the posterior rectus sheath and transversalis fascia in order to fully cover the mesh, creating a natural tisssue barrier for the bowel and peritoneal cavity.  Description of Procedure: The patient was correctly identified in the preoperative area and brought into the operating room.  The patient was placed supine with VTE prophylaxis in place.  Appropriate time-outs were performed.  Anesthesia was induced and the patient was intubated.  Foley catheter was placed.  Appropriate antibiotics were infused.  The abdomen was prepped and draped in a sterile fashion. A supraumbilical incision was made. Cautery was used to dissect along the umbilical stalk and to free the stalk from the underlying fascia, revealing a 1 cm hernia defect.  The fascial edges were cleared and a 12 mm trocar was inserted.  Pneumoperitoneum  was obtained with appropriate opening pressures.  A Veress needle was used to start dissecting the peritoneal flap.  Two 8-mm robotic ports were placed in the right and left lateral positions under direct visualization.  A large right and left Bard 3D Max Mid Mesh, a 2-0 Vicryl, and two 2-0 vlock sutures were placed through the umbilical port under direct visualization.  The [employer] platform was docked onto the patient, the camera was inserted and targeted, and the instruments were placed under direct visualization.  Both inguinal regions were inspected for hernias and it was confirmed that the patient had bilateral direct inguinal hernias.  We started on the left side.  Using electocautery, the peritoneal and posterior rectus tissue flap was created.  The peritoneum on the left side was scored from the median umbilical ligament laterally towards the ASIS.  The flap was mobilized using robotic scissors and the bipolar instruments, creating a plane along the posterior rectus sheath and transversalis fascia down to the pubic tubercle medially. It was then further mobilized laterally across the inguinal canal and femoral vessels and onto the psoas muscle. The inferior epigastric vessels were identified and preserved. This created a posterior rectus and peritoneal flap measuring roughly 17 cm x 12 cm.  The hernia sac and contents were reduced preserving all structures.  A large left Bard 3D Max Mid mesh was placed with good overlap along all the potential hernia defects and secured in place with 2-0 Vicryl along the medial superomedial and superolateral aspects.    We then moved to the right side.  Using electocautery, the peritoneal and posterior rectus tissue flap was created.  The peritoneum on the right  side was scored from the median umbilical ligament laterally towards the ASIS.  The flap was mobilized using robotic scissors and the bipolar instruments, creating a plane along the posterior rectus sheath and  transversalis fascia down to the pubic tubercle medially. It was then further mobilized laterally across the inguinal canal and femoral vessels and onto the psoas muscle. The inferior epigastric vessels were identified and preserved. This created a posterior rectus and peritoneal flap measuring roughly 17 cm x 12 cm.  The hernia sac and contents were reduced preserving all structures.  A large right Bard 3D Max Mid mesh was placed with good overlap along all the potential hernia defects and secured in place with 2-0 Vicryl along the medial superomedial and superolateral aspects. Then, the peritoneal flaps on both sides were advanced over the mesh and carried over to close the defect. A running 2-0 V lock suture was used to approximate the edge of the flap onto the peritoneum on each side.  All needles were removed under direct visualization.  The 8- mm ports were removed under direct visualization and the Hasson trocar was removed.  The hernia defect was closed using 0 vicryl sutures.  Local anesthetic was infused in all incisions as well as a bilateral ilioinguinal block.  The umbilical stalk was reattached using 2-0 Vicryl suture and the umbilical incision was closed using 3-0 Vicryl and 4-0 Monocryl.  The other port incisions were closed with 4-0 Monocryl.  The wounds were cleaned and sealed with DermaBond.  Foley catheter was removed and the patient was emerged from anesthesia and extubated and brought to the recovery room for further management.  The patient tolerated the procedure well and all counts were correct at the end of the case.   Howie Ill, MD

## 2023-01-09 NOTE — Anesthesia Procedure Notes (Addendum)
Procedure Name: Intubation Date/Time: 01/09/2023 9:57 AM  Performed by: Cheral Bay, CRNAPre-anesthesia Checklist: Patient identified, Emergency Drugs available, Suction available and Patient being monitored Patient Re-evaluated:Patient Re-evaluated prior to induction Oxygen Delivery Method: Circle system utilized Preoxygenation: Pre-oxygenation with 100% oxygen Induction Type: IV induction Ventilation: Two handed mask ventilation required Laryngoscope Size: McGraph and 3 Grade View: Grade I Tube type: Oral Tube size: 7.0 mm Number of attempts: 1 Airway Equipment and Method: Stylet Placement Confirmation: ETT inserted through vocal cords under direct vision, positive ETCO2 and breath sounds checked- equal and bilateral Secured at: 20 cm Tube secured with: Tape Dental Injury: Teeth and Oropharynx as per pre-operative assessment  Comments: Diff mask due to beard but able to ventilate

## 2023-01-09 NOTE — Anesthesia Postprocedure Evaluation (Signed)
Anesthesia Post Note  Patient: John Franco  Procedure(s) Performed: XI ROBOTIC ASSISTED INGUINAL HERNIA REPAIR WITH MESH (Bilateral: Inguinal) HERNIA REPAIR UMBILICAL ADULT (Abdomen)  Patient location during evaluation: PACU Anesthesia Type: General Level of consciousness: awake and alert Pain management: pain level controlled Vital Signs Assessment: post-procedure vital signs reviewed and stable Respiratory status: spontaneous breathing, nonlabored ventilation, respiratory function stable and patient connected to nasal cannula oxygen Cardiovascular status: blood pressure returned to baseline and stable Postop Assessment: no apparent nausea or vomiting Anesthetic complications: no   No notable events documented.   Last Vitals:  Vitals:   01/09/23 1255 01/09/23 1300  BP:  130/72  Pulse: 78 72  Resp: 18 16  Temp:    SpO2: 95% 96%    Last Pain:  Vitals:   01/09/23 1245  TempSrc:   PainSc: 0-No pain                 Louie Boston

## 2023-01-10 ENCOUNTER — Encounter: Payer: Self-pay | Admitting: Surgery

## 2023-01-23 ENCOUNTER — Encounter: Payer: Self-pay | Admitting: Physician Assistant

## 2023-01-23 ENCOUNTER — Ambulatory Visit (INDEPENDENT_AMBULATORY_CARE_PROVIDER_SITE_OTHER): Payer: PPO | Admitting: Physician Assistant

## 2023-01-23 VITALS — BP 154/79 | HR 75 | Temp 97.7°F | Ht 65.0 in | Wt 201.2 lb

## 2023-01-23 DIAGNOSIS — Z09 Encounter for follow-up examination after completed treatment for conditions other than malignant neoplasm: Secondary | ICD-10-CM

## 2023-01-23 DIAGNOSIS — K4020 Bilateral inguinal hernia, without obstruction or gangrene, not specified as recurrent: Secondary | ICD-10-CM

## 2023-01-23 DIAGNOSIS — K429 Umbilical hernia without obstruction or gangrene: Secondary | ICD-10-CM

## 2023-01-23 DIAGNOSIS — K4090 Unilateral inguinal hernia, without obstruction or gangrene, not specified as recurrent: Secondary | ICD-10-CM

## 2023-01-23 NOTE — Progress Notes (Signed)
[facility] POST-OP OFFICE VISIT  01/23/2023  HPI: John Franco is a 72 y.o. male 14 days s/p robotic assisted laparoscopic bilateral inguinal and open umbilical hernia repairs with Dr Aleen Campi   He is doing very well Only with minimal soreness; improving each day Has only needed ibuprofen No fever, chills, nausea, emesis Bowel movements are actually better than compared to pre-operatively No scrotal swelling or bruising Incisions are healing well reportedly  Vital signs: BP (!) 154/79   Pulse 75   Temp 97.7 F (36.5 C) (Oral)   Ht 5\' 5"  (1.651 m)   Wt 201 lb 3.2 oz (91.3 kg)   SpO2 96%   BMI 33.48 kg/m    Physical Exam: Constitutional: Well appearing male, NAD Abdomen: Soft, non-tender, non-distended, no rebound/guarding Skin: Laparoscopic incisions are healing well, healing ecchymosis, no erythema or drainage   Assessment/Plan: This is a 72 y.o. male 14 days s/p robotic assisted laparoscopic bilateral inguinal and open umbilical hernia repairs with Dr Aleen Campi    - Pain control prn; OTC modalities seem to be sufficient   - Reviewed wound care recommendation  - Reviewed lifting restrictions; 6 weeks total  - He can follow up on as needed basis; He understands to call with questions/concerns  -- Lynden Oxford, PA-C [facility] 01/23/2023, 2:33 PM M-F: 7am - 4pm

## 2023-01-23 NOTE — Patient Instructions (Signed)

## 2023-02-25 ENCOUNTER — Other Ambulatory Visit: Payer: Self-pay | Admitting: Family Medicine

## 2023-02-25 DIAGNOSIS — I10 Essential (primary) hypertension: Secondary | ICD-10-CM

## 2023-03-23 ENCOUNTER — Other Ambulatory Visit: Payer: Self-pay | Admitting: Family Medicine

## 2023-03-23 DIAGNOSIS — Z1212 Encounter for screening for malignant neoplasm of rectum: Secondary | ICD-10-CM

## 2023-03-23 DIAGNOSIS — Z1211 Encounter for screening for malignant neoplasm of colon: Secondary | ICD-10-CM

## 2023-05-04 NOTE — Progress Notes (Signed)
Pharmacy Quality Measure Review  This patient is appearing on a report for being at risk of failing the adherence measure for hypertension (ACEi/ARB) medications this calendar year.   Medication: valsartan/hydrochlorothiazide 80/12.5mg  Last fill date: 04/23/23 for 90 day supply  Insurance report was not up to date. No action needed at this time.   Lenna Gilford, PharmD, DPLA

## 2023-06-01 ENCOUNTER — Encounter: Payer: Self-pay | Admitting: Family Medicine

## 2023-06-01 ENCOUNTER — Ambulatory Visit (INDEPENDENT_AMBULATORY_CARE_PROVIDER_SITE_OTHER): Payer: PPO | Admitting: Family Medicine

## 2023-06-01 VITALS — BP 138/80 | HR 63 | Resp 18 | Ht 65.0 in | Wt 211.0 lb

## 2023-06-01 DIAGNOSIS — G8929 Other chronic pain: Secondary | ICD-10-CM

## 2023-06-01 DIAGNOSIS — M25561 Pain in right knee: Secondary | ICD-10-CM

## 2023-06-01 DIAGNOSIS — Z1159 Encounter for screening for other viral diseases: Secondary | ICD-10-CM

## 2023-06-01 DIAGNOSIS — E7800 Pure hypercholesterolemia, unspecified: Secondary | ICD-10-CM

## 2023-06-01 DIAGNOSIS — R7303 Prediabetes: Secondary | ICD-10-CM | POA: Diagnosis not present

## 2023-06-01 DIAGNOSIS — Z23 Encounter for immunization: Secondary | ICD-10-CM

## 2023-06-01 DIAGNOSIS — I10 Essential (primary) hypertension: Secondary | ICD-10-CM

## 2023-06-01 DIAGNOSIS — Z8719 Personal history of other diseases of the digestive system: Secondary | ICD-10-CM | POA: Insufficient documentation

## 2023-06-01 MED ORDER — VALSARTAN-HYDROCHLOROTHIAZIDE 80-12.5 MG PO TABS
1.0000 | ORAL_TABLET | Freq: Every day | ORAL | 1 refills | Status: DC
Start: 2023-06-01 — End: 2024-04-07

## 2023-06-01 NOTE — Assessment & Plan Note (Signed)
Last lipid panel: LDL 136, HDL 36, triglycerides 127.  Repeating lipid panel today, if ASCVD risk score still above 10% then recommend initiating statin therapy.  Continue low-fat diet and increase physical activity until then.

## 2023-06-01 NOTE — Progress Notes (Signed)
Established Patient Office Visit  Subjective   Patient ID: John Franco, male    DOB: 06/01/1951  Age: 72 y.o. MRN: [id number]  Chief Complaint  Patient presents with   Hyperlipidemia    Fasting   Hypertension   Prediabetes    HPI John Franco is a 72 y.o. male presenting today for follow up of hypertension, anemia, prediabetes.  He also complains of continued chronic knee pain with right worse than left.  He has started taking a few supplements that have provided some relief, but it is painful enough that he would like to see orthopedics to discuss other management options such as injections or [employer] that he saw on TV. Hypertension: Pt denies chest pain, SOB, dizziness, edema, syncope, fatigue or heart palpitations. Taking valsartan-hydrochlorothiazide, reports excellent compliance with treatment. Denies side effects. Hyperlipidemia: Currently consuming a low fat diet.  The 10-year ASCVD risk score (Arnett DK, et al., 2019) is: 29.7% Prediabetes: denies hypoglycemic events, wounds or sores that are not healing well, increased thirst or urination.   Outpatient Medications Prior to Visit  Medication Sig   acetaminophen (TYLENOL) 500 MG tablet Take 2 tablets (1,000 mg total) by mouth every 6 (six) hours as needed for mild pain.   ibuprofen (ADVIL) 600 MG tablet Take 1 tablet (600 mg total) by mouth every 8 (eight) hours as needed for moderate pain.   triamcinolone cream (KENALOG) 0.1 % Apply 1 Application topically 2 (two) times daily. (Patient taking differently: Apply 1 Application topically 2 (two) times daily as needed. leg)   [DISCONTINUED] valsartan-hydrochlorothiazide (DIOVAN-HCT) 80-12.5 MG tablet TAKE 1 TABLET BY MOUTH EVERY DAY   No facility-administered medications prior to visit.    ROS Negative unless otherwise noted in HPI   Objective:     BP 138/80 (BP Location: Right Arm, Patient Position: Sitting, Cuff Size: Large)   Pulse 63   Resp 18   Ht 5\' 5"  (1.651 m)    Wt 211 lb (95.7 kg)   SpO2 97%   BMI 35.11 kg/m   Physical Exam Constitutional:      General: He is not in acute distress.    Appearance: Normal appearance.  HENT:     Head: Normocephalic and atraumatic.  Cardiovascular:     Rate and Rhythm: Normal rate and regular rhythm.     Heart sounds: Normal heart sounds. No murmur heard.    No friction rub. No gallop.  Pulmonary:     Effort: Pulmonary effort is normal. No respiratory distress.     Breath sounds: Normal breath sounds. No wheezing, rhonchi or rales.  Skin:    General: Skin is warm and dry.  Neurological:     Mental Status: He is alert and oriented to person, place, and time.  Psychiatric:        Mood and Affect: Mood normal.     Assessment & Plan:  Primary hypertension Assessment & Plan: BP goal <130/80.  Stable, at goal.  Continue valsartan-hydrochlorothiazide 80-12.5 mg daily.  Repeating CMP today.  Will continue to monitor.  Orders: -     CBC with Differential/Platelet; Future -     Comprehensive metabolic panel; Future -     Valsartan-hydroCHLOROthiazide; Take 1 tablet by mouth daily.  Dispense: 90 tablet; Refill: 1  Pure hypercholesterolemia Assessment & Plan: Last lipid panel: LDL 136, HDL 36, triglycerides 127.  Repeating lipid panel today, if ASCVD risk score still above 10% then recommend initiating statin therapy.  Continue low-fat diet and increase physical  activity until then.  Orders: -     CBC with Differential/Platelet; Future -     Comprehensive metabolic panel; Future -     Lipid panel; Future  Pre-diabetes Assessment & Plan: Last A1c 5.9, fairly stable and in the prediabetes category.  Rechecking A1c today.  Recommend low carbohydrate/sugar nutrition and getting routine physical activity.  Will continue to monitor.  Orders: -     Hemoglobin A1c; Future  Screening for viral disease -     Hepatitis C antibody; Future  Need for influenza vaccination -     Flu Vaccine Trivalent High Dose  (Fluad)  Chronic pain of right knee -     Ambulatory referral to Orthopedic Surgery    Return in about 6 months (around 11/30/2023) for follow-up for HTN, HLD, DM, fasting blood work 1 week before.    Melida Quitter, PA

## 2023-06-01 NOTE — Assessment & Plan Note (Addendum)
BP goal <130/80.  Stable, at goal.  Continue valsartan-hydrochlorothiazide 80-12.5 mg daily.  Repeating CMP today.  Will continue to monitor.

## 2023-06-01 NOTE — Patient Instructions (Addendum)
I have included some additional information about the protection that is recommended for you.  If you decide that you would like any of them, you can go to your local pharmacy!  -Tetanus booster -Pneumonia shot -Shingles shot (2 dose series)

## 2023-06-01 NOTE — Assessment & Plan Note (Signed)
Last A1c 5.9, fairly stable and in the prediabetes category.  Rechecking A1c today.  Recommend low carbohydrate/sugar nutrition and getting routine physical activity.  Will continue to monitor.

## 2023-06-02 LAB — COMPREHENSIVE METABOLIC PANEL
ALT: 13 [IU]/L (ref 0–44)
AST: 15 [IU]/L (ref 0–40)
Albumin: 4 g/dL (ref 3.8–4.8)
Alkaline Phosphatase: 86 [IU]/L (ref 44–121)
BUN/Creatinine Ratio: 16 (ref 10–24)
BUN: 18 mg/dL (ref 8–27)
Bilirubin Total: 0.2 mg/dL (ref 0.0–1.2)
CO2: 24 mmol/L (ref 20–29)
Calcium: 9.6 mg/dL (ref 8.6–10.2)
Chloride: 102 mmol/L (ref 96–106)
Creatinine, Ser: 1.11 mg/dL (ref 0.76–1.27)
Globulin, Total: 2.6 g/dL (ref 1.5–4.5)
Glucose: 93 mg/dL (ref 70–99)
Potassium: 4.4 mmol/L (ref 3.5–5.2)
Sodium: 141 mmol/L (ref 134–144)
Total Protein: 6.6 g/dL (ref 6.0–8.5)
eGFR: 71 mL/min/{1.73_m2} (ref >59)

## 2023-06-02 LAB — CBC WITH DIFFERENTIAL/PLATELET
Basophils Absolute: 0 10*3/uL (ref 0.0–0.2)
Basos: 1 %
EOS (ABSOLUTE): 0.2 10*3/uL (ref 0.0–0.4)
Eos: 3 %
Hematocrit: 43.4 % (ref 37.5–51.0)
Hemoglobin: 14.2 g/dL (ref 13.0–17.7)
Immature Grans (Abs): 0 10*3/uL (ref 0.0–0.1)
Immature Granulocytes: 0 %
Lymphocytes Absolute: 1.4 10*3/uL (ref 0.7–3.1)
Lymphs: 24 %
MCH: 30.1 pg (ref 26.6–33.0)
MCHC: 32.7 g/dL (ref 31.5–35.7)
MCV: 92 fL (ref 79–97)
Monocytes Absolute: 0.5 10*3/uL (ref 0.1–0.9)
Monocytes: 9 %
Neutrophils Absolute: 3.8 10*3/uL (ref 1.4–7.0)
Neutrophils: 63 %
Platelets: 267 10*3/uL (ref 150–450)
RBC: 4.72 x10E6/uL (ref 4.14–5.80)
RDW: 12.3 % (ref 11.6–15.4)
WBC: 5.9 10*3/uL (ref 3.4–10.8)

## 2023-06-02 LAB — LIPID PANEL
Chol/HDL Ratio: 4.9 {ratio} (ref 0.0–5.0)
Cholesterol, Total: 191 mg/dL (ref 100–199)
HDL: 39 mg/dL — ABNORMAL LOW (ref >39)
LDL Chol Calc (NIH): 124 mg/dL — ABNORMAL HIGH (ref 0–99)
Triglycerides: 154 mg/dL — ABNORMAL HIGH (ref 0–149)
VLDL Cholesterol Cal: 28 mg/dL (ref 5–40)

## 2023-06-02 LAB — HEMOGLOBIN A1C
Est. average glucose Bld gHb Est-mCnc: 123 mg/dL
Hgb A1c MFr Bld: 5.9 % — ABNORMAL HIGH (ref 4.8–5.6)

## 2023-06-02 LAB — HEPATITIS C ANTIBODY: Hep C Virus Ab: NONREACTIVE

## 2023-06-07 ENCOUNTER — Other Ambulatory Visit (INDEPENDENT_AMBULATORY_CARE_PROVIDER_SITE_OTHER): Payer: PPO

## 2023-06-07 ENCOUNTER — Ambulatory Visit: Payer: PPO | Admitting: Physician Assistant

## 2023-06-07 ENCOUNTER — Encounter: Payer: Self-pay | Admitting: Physician Assistant

## 2023-06-07 DIAGNOSIS — M25561 Pain in right knee: Secondary | ICD-10-CM | POA: Diagnosis not present

## 2023-06-07 DIAGNOSIS — G8929 Other chronic pain: Secondary | ICD-10-CM

## 2023-06-07 NOTE — Progress Notes (Signed)
Office Visit Note   Patient: John Franco           Date of Birth: [birth year]           MRN: [id number] Visit Date: 06/07/2023              Requested by: Melida Quitter, PA [address] [place],  [place] [address] PCP: Melida Quitter, PA   Assessment & Plan: Visit Diagnoses:  1. Chronic pain of right knee     Plan: Pleasant 72 year old healthy gentleman coming in with a chronic history of right knee pain.  Denies any injuries.  Has tried anti-inflammatories and some topical medication.  X-rays today did not demonstrate advanced varus arthritis of the right knee.  Discussed with him options.  Certainly he could try a steroid shot and/or viscosupplementation.  Talked about keeping his leg strong.  Talked about bracing.  Ultimately if pain got too bad would be a candidate for knee replacement.  He would like to think about all this.  He is willing to try some Voltaren gel declined a steroid injection today will contact me if he changes his mind  Follow-Up Instructions: No follow-ups on file.   Orders:  Orders Placed This Encounter  Procedures   XR KNEE 3 VIEW RIGHT   No orders of the defined types were placed in this encounter.     Procedures: No procedures performed   Clinical Data: No additional findings.   Subjective: No chief complaint on file.   HPI pleasant 72 year old gentleman with a chief complaint of right knee pain.  Is been going on for a while.  Bothers him on and off.  He still is able to take care of his wife.  No night pain.  No injury.  Review of Systems  All other systems reviewed and are negative.    Objective: Vital Signs: There were no vitals taken for this visit.  Physical Exam Constitutional:      Appearance: Normal appearance.  Skin:    General: Skin is warm and dry.  Neurological:     General: No focal deficit present.     Mental Status: He is alert.  Psychiatric:        Mood and Affect: Mood normal.        Behavior:  Behavior normal.     Ortho Exam Right knee obviously varus alignment he has some lower extremity swelling but no cellulitis compartments are soft and compressible he is neurovascularly intact.  No effusion or erythema about his knee he does have more tenderness over the medial and lateral joint line with grinding with range of motion Specialty Comments:  No specialty comments available.  Imaging: XR KNEE 3 VIEW RIGHT Result Date: 06/07/2023 Radiographs of his right knee were taken today.  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  Varus alignment    PMFS History: Patient Active Problem List   Diagnosis Date Noted   S/P bilateral inguinal hernia repair 06/01/2023   Pain in right knee 11/30/2022   Hypertension 11/28/2021   Pre-diabetes 09/02/2018   Pure hypercholesterolemia 09/02/2018   Past Medical History:  Diagnosis Date   GERD (gastroesophageal reflux disease)    Hyperlipidemia    Hypertension    Left inguinal hernia 12/2022   Non-recurrent bilateral inguinal hernia without obstruction or gangrene 01/09/2023   Pre-diabetes    Umbilical hernia 12/2022   Umbilical hernia without obstruction and without  gangrene 01/09/2023    Family History  Problem Relation Age of Onset   Arthritis Mother    Cancer Mother        breast   Heart attack Father    Cancer Brother        colon   Cancer Maternal Grandmother        breast   Pulmonary embolism Maternal Grandfather    Cancer Paternal Grandmother        ovarian   Cancer Paternal Grandfather        prostate    Past Surgical History:  Procedure Laterality Date   FOOT SURGERY Left 1987   heel fracture   HARDWARE REMOVAL Left    heel   UMBILICAL HERNIA REPAIR N/A 01/09/2023   Procedure: HERNIA REPAIR UMBILICAL ADULT;  Surgeon: Henrene Dodge, MD;  Location: [facility] ORS;  Service: General;  Laterality: N/A;   XI ROBOTIC ASSISTED INGUINAL  HERNIA REPAIR WITH MESH Bilateral 01/09/2023   Procedure: XI ROBOTIC ASSISTED INGUINAL HERNIA REPAIR WITH MESH;  Surgeon: Henrene Dodge, MD;  Location: [facility] OR[facility];  Service: General;  Laterality: Bilateral;   Social History   Occupational History   Not on file  Tobacco Use   Smoking status: Former    Types: Pipe    Quit date: 06/19/1978    Years since quitting: 44.9    Passive exposure: Past   Smokeless tobacco: Never  Vaping Use   Vaping status: Never Used  Substance and Sexual Activity   Alcohol use: Yes    Comment: very seldom   Drug use: No   Sexual activity: Not Currently

## 2023-07-26 ENCOUNTER — Encounter: Payer: Self-pay | Admitting: Family Medicine

## 2023-07-26 ENCOUNTER — Ambulatory Visit: Payer: PPO

## 2023-07-26 DIAGNOSIS — Z0000 Encounter for general adult medical examination without abnormal findings: Secondary | ICD-10-CM

## 2023-07-26 NOTE — Patient Instructions (Signed)
 Mr. John Franco , Thank you for taking time to come for your Medicare Wellness Visit. I appreciate your ongoing commitment to your health goals. Please review the following plan we discussed and let me know if I can assist you in the future.   Referrals/Orders/Follow-Ups/Clinician Recommendations: none  This is a list of the screening recommended for you and due dates:  Health Maintenance  Topic Date Due   Cologuard (Stool DNA test)  Never done   Zoster (Shingles) Vaccine (1 of 2) Never done   Pneumonia Vaccine (1 of 1 - PCV) Never done   COVID-19 Vaccine (6 - 2024-25 season) 02/18/2023   Medicare Annual Wellness Visit  07/25/2024   DTaP/Tdap/Td vaccine (2 - Td or Tdap) 06/05/2033   Flu Shot  Completed   Hepatitis C Screening  Completed   HPV Vaccine  Aged Out    Advanced directives: (ACP Link)Information on Advanced Care Planning can be found at [place]  Secretary of [facility] Advance Health Care Directives Advance Health Care Directives ([email or web address]   Next Medicare Annual Wellness Visit scheduled for next year: Yes  insert Preventive Care attachment Insert FALL PREVENTION attachment if needed

## 2023-07-26 NOTE — Progress Notes (Signed)
 Subjective:   John Franco is a 73 y.o. male who presents for Medicare Annual/Subsequent preventive examination.  Visit Complete: Virtual I connected with  Starleen Sor on 07/26/23 by a audio enabled telemedicine application and verified that I am speaking with the correct person using two identifiers.  Interactive audio and video telecommunications were attempted between this provider and patient, however failed, due to patient having technical difficulties OR patient did not have access to video capability.  We continued and completed visit with audio only.  Patient Location: Home  Provider Location: Home Office  I discussed the limitations of evaluation and management by telemedicine. The patient expressed understanding and agreed to proceed.  Vital Signs: Because this visit was a virtual/telehealth visit, some criteria may be missing or patient reported. Any vitals not documented were not able to be obtained and vitals that have been documented are patient reported.    Cardiac Risk Factors include: advanced age (>79men, >79 women);hypertension;male gender     Objective:    Today's Vitals   There is no height or weight on file to calculate BMI.     07/26/2023    9:41 AM 01/09/2023    8:53 AM 01/01/2023    1:48 PM 07/31/2018   11:03 AM  Advanced Directives  Does Patient Have a Medical Advance Directive? No No No No  Would patient like information on creating a medical advance directive? No - Patient declined No - Patient declined  No - Patient declined    Current Medications (verified) Outpatient Encounter Medications as of 07/26/2023  Medication Sig   acetaminophen  (TYLENOL ) 500 MG tablet Take 2 tablets (1,000 mg total) by mouth every 6 (six) hours as needed for mild pain.   ibuprofen  (ADVIL ) 600 MG tablet Take 1 tablet (600 mg total) by mouth every 8 (eight) hours as needed for moderate pain.   triamcinolone  cream (KENALOG ) 0.1 % Apply 1 Application topically 2 (two) times  daily. (Patient taking differently: Apply 1 Application topically 2 (two) times daily as needed. leg)   valsartan -hydrochlorothiazide  (DIOVAN -HCT) 80-12.5 MG tablet Take 1 tablet by mouth daily.   No facility-administered encounter medications on file as of 07/26/2023.    Allergies (verified) Codeine   History: Past Medical History:  Diagnosis Date   GERD (gastroesophageal reflux disease)    Hyperlipidemia    Hypertension    Left inguinal hernia 12/2022   Non-recurrent bilateral inguinal hernia without obstruction or gangrene 01/09/2023   Pre-diabetes    Umbilical hernia 12/2022   Umbilical hernia without obstruction and without gangrene 01/09/2023   Past Surgical History:  Procedure Laterality Date   FOOT SURGERY Left 1987   heel fracture   HARDWARE REMOVAL Left    heel   UMBILICAL HERNIA REPAIR N/A 01/09/2023   Procedure: HERNIA REPAIR UMBILICAL ADULT;  Surgeon: Desiderio Schanz, MD;  Location: [facility] ORS;  Service: General;  Laterality: N/A;   XI ROBOTIC ASSISTED INGUINAL HERNIA REPAIR WITH MESH Bilateral 01/09/2023   Procedure: XI ROBOTIC ASSISTED INGUINAL HERNIA REPAIR WITH MESH;  Surgeon: Desiderio Schanz, MD;  Location: [facility] OR[facility];  Service: General;  Laterality: Bilateral;   Family History  Problem Relation Age of Onset   Arthritis Mother    Cancer Mother        breast   Heart attack Father    Cancer Brother        colon   Cancer Maternal Grandmother        breast   Pulmonary embolism Maternal Grandfather  Cancer Paternal Grandmother        ovarian   Cancer Paternal Grandfather        prostate   Social History   Socioeconomic History   Marital status: Married    Spouse name: Dorthea   Number of children: 2   Years of education: Not on file   Highest education level: Not on file  Occupational History   Not on file  Tobacco Use   Smoking status: Former    Types: Pipe    Quit date: 06/19/1978    Years since quitting: 45.1    Passive exposure: Past   Smokeless  tobacco: Never  Vaping Use   Vaping status: Never Used  Substance and Sexual Activity   Alcohol use: Not Currently    Comment: very seldom   Drug use: No   Sexual activity: Not Currently  Other Topics Concern   Not on file  Social History Narrative   Not on file   Social Drivers of Health   Financial Resource Strain: Low Risk  (07/26/2023)   Overall Financial Resource Strain (CARDIA)    Difficulty of Paying Living Expenses: Not hard at all  Food Insecurity: No Food Insecurity (07/26/2023)   Hunger Vital Sign    Worried About Running Out of Food in the Last Year: Never true    Ran Out of Food in the Last Year: Never true  Transportation Needs: No Transportation Needs (07/26/2023)   PRAPARE - Administrator, Civil Service (Medical): No    Lack of Transportation (Non-Medical): No  Physical Activity: Inactive (07/26/2023)   Exercise Vital Sign    Days of Exercise per Week: 0 days    Minutes of Exercise per Session: 0 min  Stress: No Stress Concern Present (07/26/2023)   [employer] of Occupational Health - Occupational Stress Questionnaire    Feeling of Stress : Only a little  Social Connections: Moderately Isolated (07/26/2023)   Social Connection and Isolation Panel [NHANES]    Frequency of Communication with Friends and Family: More than three times a week    Frequency of Social Gatherings with Friends and Family: Once a week    Attends Religious Services: Never    Database Administrator or Organizations: No    Attends Engineer, Structural: Never    Marital Status: Married    Tobacco Counseling Counseling given: Not Answered   Clinical Intake:  Pre-visit preparation completed: Yes  Pain : No/denies pain     Nutritional Risks: None Diabetes: No  How often do you need to have someone help you when you read instructions, pamphlets, or other written materials from your doctor or pharmacy?: 1 - Never  Interpreter Needed?: No  Information entered  by :: NAllen LPN   Activities of Daily Living    07/26/2023    9:33 AM 01/09/2023    9:04 AM  In your present state of health, do you have any difficulty performing the following activities:  Hearing? 0 0  Comment decreased hearing high frequency left ear   Vision? 0 0  Difficulty concentrating or making decisions? 0 0  Walking or climbing stairs? 1 1  Comment due to knees due to Right knee pain  Dressing or bathing? 0 0  Doing errands, shopping? 0   Preparing Food and eating ? N   Using the Toilet? N   In the past six months, have you accidently leaked urine? Y   Comment sometimes when first gets up  Do you have problems with loss of bowel control? N   Managing your Medications? N   Managing your Finances? N   Housekeeping or managing your Housekeeping? N     Patient Care Team: Wallace Joesph LABOR, PA as PCP - General (Family Medicine)  Indicate any recent Medical Services you may have received from other than [facility] providers in the past year (date may be approximate).     Assessment:   This is a routine wellness examination for [place].  Hearing/Vision screen Hearing Screening - Comments:: Denies hearing issues Vision Screening - Comments:: No regular eye exams   Goals Addressed             This Visit's Progress    Patient Stated       07/26/2023, get knees better       Depression Screen    07/26/2023    9:43 AM 07/04/2022    2:09 PM 11/28/2021    8:43 AM 05/30/2021    3:09 PM 03/13/2021   11:14 AM 11/12/2020   11:30 AM 09/30/2020    1:42 PM  PHQ 2/9 Scores  PHQ - 2 Score 0 2 2 0 0 0 0  PHQ- 9 Score  6 5 4  2  0    Fall Risk    07/26/2023    9:42 AM 07/04/2022    2:11 PM 05/30/2021    3:09 PM 03/13/2021   11:14 AM 11/12/2020   11:04 AM  Fall Risk   Falls in the past year? 1 0 1 0 0  Comment stepped over the dog      Number falls in past yr: 0 0 0 0 0  Injury with Fall? 0 0 0 0 0  Risk for fall due to : Medication side effect  History of fall(s);Impaired  balance/gait Orthopedic patient   Follow up Falls prevention discussed;Falls evaluation completed Falls evaluation completed Falls evaluation completed Falls evaluation completed Falls evaluation completed    MEDICARE RISK AT HOME: Medicare Risk at Home Any stairs in or around the home?: Yes If so, are there any without handrails?: No Home free of loose throw rugs in walkways, pet beds, electrical cords, etc?: Yes Adequate lighting in your home to reduce risk of falls?: Yes Life alert?: No Use of a cane, walker or w/c?: No Grab bars in the bathroom?: No Shower chair or bench in shower?: No Elevated toilet seat or a handicapped toilet?: No  TIMED UP AND GO:  Was the test performed?  No    Cognitive Function:        07/26/2023    9:44 AM 07/04/2022    2:00 PM 03/13/2021   11:16 AM  6CIT Screen  What Year? 0 points 0 points 0 points  What month? 0 points 0 points 0 points  What time? 0 points 0 points 0 points  Count back from 20 0 points 0 points 0 points  Months in reverse 0 points 2 points 0 points  Repeat phrase 4 points 0 points 0 points  Total Score 4 points 2 points 0 points    Immunizations Immunization History  Administered Date(s) Administered   Fluad Trivalent(High Dose 65+) 06/01/2023   Influenza, High Dose Seasonal PF 09/02/2018, 06/16/2021   PFIZER(Purple Top)SARS-COV-2 Vaccination 09/11/2019, 10/02/2019, 06/28/2020, 12/15/2020   Pfizer Covid-19 Vaccine Bivalent Booster 66yrs & up 06/16/2021   Tdap 06/06/2023    TDAP status: Up to date  Flu Vaccine status: Up to date  Pneumococcal vaccine status: Due, Education  has been provided regarding the importance of this vaccine. Advised may receive this vaccine at local pharmacy or Health Dept. Aware to provide a copy of the vaccination record if obtained from local pharmacy or Health Dept. Verbalized acceptance and understanding.  Covid-19 vaccine status: Information provided on how to obtain vaccines.    Qualifies for Shingles Vaccine? Yes   Zostavax completed No   Shingrix  Completed?: No.    Education has been provided regarding the importance of this vaccine. Patient has been advised to call insurance company to determine out of pocket expense if they have not yet received this vaccine. Advised may also receive vaccine at local pharmacy or Health Dept. Verbalized acceptance and understanding.  Screening Tests Health Maintenance  Topic Date Due   Fecal DNA (Cologuard)  Never done   Zoster Vaccines- Shingrix  (1 of 2) Never done   Pneumonia Vaccine 82+ Years old (1 of 1 - PCV) Never done   COVID-19 Vaccine (6 - 2024-25 season) 02/18/2023   Medicare Annual Wellness (AWV)  07/25/2024   DTaP/Tdap/Td (2 - Td or Tdap) 06/05/2033   INFLUENZA VACCINE  Completed   Hepatitis C Screening  Completed   HPV VACCINES  Aged Out    Health Maintenance  Health Maintenance Due  Topic Date Due   Fecal DNA (Cologuard)  Never done   Zoster Vaccines- Shingrix  (1 of 2) Never done   Pneumonia Vaccine 61+ Years old (1 of 1 - PCV) Never done   COVID-19 Vaccine (6 - 2024-25 season) 02/18/2023    Colorectal cancer screening: has cologuard kit  Lung Cancer Screening: (Low Dose CT Chest recommended if Age 84-80 years, 20 pack-year currently smoking OR have quit w/in 15years.) does not qualify.   Lung Cancer Screening Referral: no  Additional Screening:  Hepatitis C Screening: does qualify; Completed 06/01/2023  Vision Screening: Recommended annual ophthalmology exams for early detection of glaucoma and other disorders of the eye. Is the patient up to date with their annual eye exam?  No  Who is the provider or what is the name of the office in which the patient attends annual eye exams? none If pt is not established with a provider, would they like to be referred to a provider to establish care? No .   Dental Screening: Recommended annual dental exams for proper oral hygiene  Diabetic Foot Exam:  n/a  Community Resource Referral / Chronic Care Management: CRR required this visit?  No   CCM required this visit?  No     Plan:     I have personally reviewed and noted the following in the patient's chart:   Medical and social history Use of alcohol, tobacco or illicit drugs  Current medications and supplements including opioid prescriptions. Patient is not currently taking opioid prescriptions. Functional ability and status Nutritional status Physical activity Advanced directives List of other physicians Hospitalizations, surgeries, and ER visits in previous 12 months Vitals Screenings to include cognitive, depression, and falls Referrals and appointments  In addition, I have reviewed and discussed with patient certain preventive protocols, quality metrics, and best practice recommendations. A written personalized care plan for preventive services as well as general preventive health recommendations were provided to patient.     Ardella FORBES Dawn, LPN   12/19/7972   After Visit Summary: (Pick Up) Due to this being a telephonic visit, with patients personalized plan was offered to patient and patient has requested to Pick up at office.  Nurse Notes: none

## 2023-07-27 ENCOUNTER — Encounter: Payer: Self-pay | Admitting: Family Medicine

## 2023-11-30 ENCOUNTER — Ambulatory Visit: Payer: PPO | Admitting: Family Medicine

## 2024-01-17 ENCOUNTER — Ambulatory Visit (INDEPENDENT_AMBULATORY_CARE_PROVIDER_SITE_OTHER)

## 2024-01-17 VITALS — BP 148/83 | HR 68 | Ht 65.0 in | Wt 231.4 lb

## 2024-01-17 DIAGNOSIS — E7800 Pure hypercholesterolemia, unspecified: Secondary | ICD-10-CM

## 2024-01-17 DIAGNOSIS — R7303 Prediabetes: Secondary | ICD-10-CM | POA: Diagnosis not present

## 2024-01-17 DIAGNOSIS — M62830 Muscle spasm of back: Secondary | ICD-10-CM | POA: Insufficient documentation

## 2024-01-17 DIAGNOSIS — Z1321 Encounter for screening for nutritional disorder: Secondary | ICD-10-CM

## 2024-01-17 DIAGNOSIS — I10 Essential (primary) hypertension: Secondary | ICD-10-CM

## 2024-01-17 MED ORDER — HYDROCHLOROTHIAZIDE 12.5 MG PO CAPS: 12.5000 mg | ORAL_CAPSULE | Freq: Every day | ORAL | 2 refills | Status: DC

## 2024-01-17 MED ORDER — CYCLOBENZAPRINE HCL 5 MG PO TABS: 5.0000 mg | ORAL_TABLET | Freq: Three times a day (TID) | ORAL | 1 refills | Status: DC | PRN

## 2024-01-17 NOTE — Patient Instructions (Addendum)
-  I have sent in the extra fluid pill, 12.5 mg of hydrochlorothiazide , which you can take daily with your current blood pressure medicine.  -Please try to check BP at home a few times per week  -I have also sent in a low dose muscle relaxer to help with back pain. Remember that this medication can make you sleepy, so you will want to start by just taking it at bedtime as needed.  -We will get fasting blood work from you next week and we can mail you a copy of the results. It was nice to meet you!  If you have any problems before your next visit feel free to message me via MyChart (minor issues or questions) or call the office, otherwise you may reach out to schedule an office visit.  Thank you! Saddie Sacks, PA-C

## 2024-01-17 NOTE — Assessment & Plan Note (Signed)
 BP goal < 130/80. BP above goal in office and on recheck today. Given recent increase in lower leg swelling, will add hydrochlorothiazide  12.5 mg to his BP regimen of Valsartan -hydrochlorothiazide  80-12.5 mg. Advised patient to check BP at home after adding this medication and notify office if readings are persistently too high/too low. Collecting fasting blood work next week (see orders). Will cont to monitor.

## 2024-01-17 NOTE — Assessment & Plan Note (Signed)
 Last lipid panel: LDL 124, HDL 39, Trig 154. The 10-year ASCVD risk score (Arnett DK, et al., 2019) is: 33.5% Rechecking with labs. If lipid panel still elevated then, recommend starting rosuvastatin 5 mg daily.

## 2024-01-17 NOTE — Progress Notes (Signed)
 Complete physical exam  Patient: John Franco   DOB: 09/29/1950   73 y.o. Male  MRN: [id number]  Subjective:    Chief Complaint  Patient presents with   Medical Management of Chronic Issues    John Franco is a 73 y.o. male who presents today for a complete physical exam. He reports consuming a general diet. He does not participate in regular exercise. He generally feels well. He reports sleeping fairly well. He does have additional problems to discuss today.   Patient reports that he has noticed slightly more swelling in his legs after a day of being on his feet. Reports that it improves with elevation and when he takes his BP medicine, but sometimes does not resolve completely. Denies HA, chest pain, vision changes.  Patient also reports that he has had mid thoracic back pain for the last 50 years, which flares up at times. He is currently taking ibuprofen , which he reports does help the pain, but sometimes does not resolve it completely, especially when he is experiencing a flare with muscle spasms. Describes the pain as a catching sensation and endorses tingling. Denies known injury. Patient reports that it has been getting better over the last few days after he started taking a collagen supplement, and he also has plans to go back to his chiropractor next week and/or see a massage therapist.  .  Most recent fall risk assessment:    07/26/2023    9:42 AM  Fall Risk   Falls in the past year? 1  Comment stepped over the dog  Number falls in past yr: 0  Injury with Fall? 0  Risk for fall due to : Medication side effect  Follow up Falls prevention discussed;Falls evaluation completed     Most recent depression screenings:    01/17/2024    2:02 PM 07/26/2023    9:43 AM  PHQ 2/9 Scores  PHQ - 2 Score 0 0  PHQ- 9 Score 0         Patient Care Team: Gayle Saddie JULIANNA DEVONNA as PCP - General (Physician Assistant)   Outpatient Medications Prior to Visit  Medication Sig   ibuprofen   (ADVIL ) 600 MG tablet Take 1 tablet (600 mg total) by mouth every 8 (eight) hours as needed for moderate pain.   triamcinolone  cream (KENALOG ) 0.1 % Apply 1 Application topically 2 (two) times daily. (Patient taking differently: Apply 1 Application topically 2 (two) times daily as needed. leg)   valsartan -hydrochlorothiazide  (DIOVAN -HCT) 80-12.5 MG tablet Take 1 tablet by mouth daily.   [DISCONTINUED] acetaminophen  (TYLENOL ) 500 MG tablet Take 2 tablets (1,000 mg total) by mouth every 6 (six) hours as needed for mild pain.   No facility-administered medications prior to visit.    ROS  Per HPI      Objective:     BP (!) 148/83   Pulse 68   Ht 5' 5 (1.651 m)   Wt 231 lb 6.4 oz (105 kg)   SpO2 96%   BMI 38.51 kg/m    Physical Exam Constitutional:      General: He is not in acute distress.    Appearance: Normal appearance.  HENT:     Right Ear: Tympanic membrane normal.     Left Ear: Tympanic membrane normal.     Mouth/Throat:     Mouth: Mucous membranes are moist.     Pharynx: Oropharynx is clear.  Eyes:     Pupils: Pupils are equal, round, and reactive to light.  Cardiovascular:     Rate and Rhythm: Normal rate and regular rhythm.     Heart sounds: Normal heart sounds. No murmur heard.    No friction rub. No gallop.  Pulmonary:     Effort: Pulmonary effort is normal. No respiratory distress.     Breath sounds: Normal breath sounds.  Abdominal:     General: Abdomen is flat. Bowel sounds are normal.     Palpations: Abdomen is soft.  Musculoskeletal:        General: No swelling.     Cervical back: Neck supple.  Lymphadenopathy:     Cervical: No cervical adenopathy.  Skin:    General: Skin is warm and dry.  Neurological:     General: No focal deficit present.     Mental Status: He is alert.  Psychiatric:        Mood and Affect: Mood normal.        Behavior: Behavior normal.        Thought Content: Thought content normal.     No results found for any visits  on 01/17/24.     Assessment & Plan:    Routine Health Maintenance and Physical Exam  Health Maintenance  Topic Date Due   Cologuard (Stool DNA test)  Never done   Pneumococcal Vaccine for age over 36 (1 of 1 - PCV) Never done   Zoster (Shingles) Vaccine (1 of 2) Never done   COVID-19 Vaccine (6 - 2024-25 season) 02/18/2023   Flu Shot  01/18/2024   Medicare Annual Wellness Visit  07/25/2024   DTaP/Tdap/Td vaccine (2 - Td or Tdap) 06/05/2033   Hepatitis C Screening  Completed   Hepatitis B Vaccine  Aged Out   HPV Vaccine  Aged Out   Meningitis B Vaccine  Aged Out    Discussed health benefits of physical activity, and encouraged him to engage in regular exercise appropriate for his age and condition.  Primary hypertension Assessment & Plan: BP goal < 130/80. BP above goal in office and on recheck today. Given recent increase in lower leg swelling, will add hydrochlorothiazide  12.5 mg to his BP regimen of Valsartan -hydrochlorothiazide  80-12.5 mg. Advised patient to check BP at home after adding this medication and notify office if readings are persistently too high/too low. Collecting fasting blood work next week (see orders). Will cont to monitor.  Orders: -     TSH; Future -     Comprehensive metabolic panel with GFR; Future -     CBC with Differential/Platelet; Future  Pure hypercholesterolemia Assessment & Plan: Last lipid panel: LDL 124, HDL 39, Trig 154. The 10-year ASCVD risk score (Arnett DK, et al., 2019) is: 33.5% Rechecking with labs. If lipid panel still elevated then, recommend starting rosuvastatin 5 mg daily.   Orders: -     Lipid panel; Future -     Comprehensive metabolic panel with GFR; Future  Pre-diabetes Assessment & Plan: Last A1c 5.9, fairly stable. Rechecking with labs. Encouraged him to continue with low carb/sugar diet and start more routine physical activity.  Orders: -     Hemoglobin A1c; Future  Encounter for vitamin deficiency screening -      VITAMIN D  25 Hydroxy (Vit-D Deficiency, Fractures); Future  Back muscle spasm Assessment & Plan: Continue Ibuprofen  as needed for pain control. Have also prescribed Flexeril  5 mg to use sparingly as needed for acute spasms. Advised patient that sedation can be a side effect so to take only at bedtime first. Patient verbalized understanding  as was in agreement with the plan. If pain worsens or does not improve consider X-Ray of T-spine.   Other orders -     hydroCHLOROthiazide ; Take 1 capsule (12.5 mg total) by mouth daily.  Dispense: 90 capsule; Refill: 2 -     Cyclobenzaprine  HCl; Take 1 tablet (5 mg total) by mouth 3 (three) times daily as needed for muscle spasms.  Dispense: 30 tablet; Refill: 1     Return in about 6 months (around 07/19/2024) for HTN, HLD, pre-DM.     Saddie JULIANNA Sacks, PA-C

## 2024-01-17 NOTE — Assessment & Plan Note (Signed)
 Last A1c 5.9, fairly stable. Rechecking with labs. Encouraged him to continue with low carb/sugar diet and start more routine physical activity.

## 2024-01-17 NOTE — Assessment & Plan Note (Signed)
 Continue Ibuprofen  as needed for pain control. Have also prescribed Flexeril  5 mg to use sparingly as needed for acute spasms. Advised patient that sedation can be a side effect so to take only at bedtime first. Patient verbalized understanding as was in agreement with the plan. If pain worsens or does not improve consider X-Ray of T-spine.

## 2024-01-24 ENCOUNTER — Other Ambulatory Visit

## 2024-01-24 DIAGNOSIS — I10 Essential (primary) hypertension: Secondary | ICD-10-CM

## 2024-01-24 DIAGNOSIS — E7800 Pure hypercholesterolemia, unspecified: Secondary | ICD-10-CM

## 2024-01-24 DIAGNOSIS — Z1321 Encounter for screening for nutritional disorder: Secondary | ICD-10-CM | POA: Diagnosis not present

## 2024-01-24 DIAGNOSIS — R7303 Prediabetes: Secondary | ICD-10-CM

## 2024-01-25 ENCOUNTER — Ambulatory Visit: Payer: Self-pay

## 2024-01-25 LAB — CBC WITH DIFFERENTIAL/PLATELET
Basophils Absolute: 0 x10E3/uL (ref 0.0–0.2)
Basos: 0 %
EOS (ABSOLUTE): 0.2 x10E3/uL (ref 0.0–0.4)
Eos: 2 %
Hematocrit: 45.6 % (ref 37.5–51.0)
Hemoglobin: 15 g/dL (ref 13.0–17.7)
Immature Grans (Abs): 0 x10E3/uL (ref 0.0–0.1)
Immature Granulocytes: 0 %
Lymphocytes Absolute: 1.5 x10E3/uL (ref 0.7–3.1)
Lymphs: 22 %
MCH: 30.4 pg (ref 26.6–33.0)
MCHC: 32.9 g/dL (ref 31.5–35.7)
MCV: 92 fL (ref 79–97)
Monocytes Absolute: 0.6 x10E3/uL (ref 0.1–0.9)
Monocytes: 9 %
Neutrophils Absolute: 4.6 x10E3/uL (ref 1.4–7.0)
Neutrophils: 67 %
Platelets: 267 x10E3/uL (ref 150–450)
RBC: 4.94 x10E6/uL (ref 4.14–5.80)
RDW: 13.1 % (ref 11.6–15.4)
WBC: 6.9 x10E3/uL (ref 3.4–10.8)

## 2024-01-25 LAB — COMPREHENSIVE METABOLIC PANEL WITH GFR
ALT: 13 IU/L (ref 0–44)
AST: 16 IU/L (ref 0–40)
Albumin: 4.1 g/dL (ref 3.8–4.8)
Alkaline Phosphatase: 75 IU/L (ref 44–121)
BUN/Creatinine Ratio: 14 (ref 10–24)
BUN: 17 mg/dL (ref 8–27)
Bilirubin Total: 0.3 mg/dL (ref 0.0–1.2)
CO2: 22 mmol/L (ref 20–29)
Calcium: 9.5 mg/dL (ref 8.6–10.2)
Chloride: 99 mmol/L (ref 96–106)
Creatinine, Ser: 1.19 mg/dL (ref 0.76–1.27)
Globulin, Total: 2.9 g/dL (ref 1.5–4.5)
Glucose: 90 mg/dL (ref 70–99)
Potassium: 4.2 mmol/L (ref 3.5–5.2)
Sodium: 138 mmol/L (ref 134–144)
Total Protein: 7 g/dL (ref 6.0–8.5)
eGFR: 64 mL/min/1.73 (ref >59)

## 2024-01-25 LAB — LIPID PANEL
Chol/HDL Ratio: 5.5 ratio — ABNORMAL HIGH (ref 0.0–5.0)
Cholesterol, Total: 213 mg/dL — ABNORMAL HIGH (ref 100–199)
HDL: 39 mg/dL — ABNORMAL LOW (ref >39)
LDL Chol Calc (NIH): 147 mg/dL — ABNORMAL HIGH (ref 0–99)
Triglycerides: 148 mg/dL (ref 0–149)
VLDL Cholesterol Cal: 27 mg/dL (ref 5–40)

## 2024-01-25 LAB — VITAMIN D 25 HYDROXY (VIT D DEFICIENCY, FRACTURES): Vit D, 25-Hydroxy: 41 ng/mL (ref 30.0–100.0)

## 2024-01-25 LAB — HEMOGLOBIN A1C
Est. average glucose Bld gHb Est-mCnc: 126 mg/dL
Hgb A1c MFr Bld: 6 % — ABNORMAL HIGH (ref 4.8–5.6)

## 2024-01-25 LAB — TSH: TSH: 1.59 u[IU]/mL (ref 0.450–4.500)

## 2024-01-28 NOTE — Telephone Encounter (Signed)
 Called and left message with Montie Dealmeida/ asked to have patient to call the office to go over lab results.

## 2024-01-30 ENCOUNTER — Telehealth: Payer: Self-pay

## 2024-01-30 NOTE — Telephone Encounter (Signed)
 Copied from CRM [id number]. Topic: Clinical - Lab/Test Results >> Jan 30, 2024  4:15 PM Dedra B wrote: Reason for CRM: Pt called for lab results. Did not relay results since two were abnormal. Patient would like a call back, but doesn't always receive calls while at home. He would also like a copy of his results mailed to his home.

## 2024-04-05 ENCOUNTER — Other Ambulatory Visit: Payer: Self-pay | Admitting: Family Medicine

## 2024-04-05 DIAGNOSIS — I10 Essential (primary) hypertension: Secondary | ICD-10-CM

## 2024-04-17 NOTE — Progress Notes (Signed)
 John Franco                                          MRN: [id number]   04/17/2024   The [facility] Quality Team Specialist reviewed this patient medical record for the purposes of chart review for care gap closure. The following were reviewed: chart review for care gap closure-controlling blood pressure.    [facility] Quality Team

## 2024-05-05 ENCOUNTER — Ambulatory Visit (INDEPENDENT_AMBULATORY_CARE_PROVIDER_SITE_OTHER): Admitting: Family Medicine

## 2024-05-05 ENCOUNTER — Ambulatory Visit: Payer: Self-pay

## 2024-05-05 ENCOUNTER — Encounter: Payer: Self-pay | Admitting: Family Medicine

## 2024-05-05 VITALS — BP 176/80 | HR 73 | Temp 98.4°F | Resp 18 | Ht 65.0 in | Wt 220.8 lb

## 2024-05-05 DIAGNOSIS — I10 Essential (primary) hypertension: Secondary | ICD-10-CM | POA: Diagnosis not present

## 2024-05-05 DIAGNOSIS — R600 Localized edema: Secondary | ICD-10-CM

## 2024-05-05 DIAGNOSIS — I872 Venous insufficiency (chronic) (peripheral): Secondary | ICD-10-CM

## 2024-05-05 MED ORDER — MUPIROCIN 2 % EX OINT: 1.0000 | TOPICAL_OINTMENT | Freq: Two times a day (BID) | CUTANEOUS | 0 refills | Status: AC

## 2024-05-05 MED ORDER — FUROSEMIDE 20 MG PO TABS: 20.0000 mg | ORAL_TABLET | Freq: Every day | ORAL | 0 refills | Status: DC

## 2024-05-05 NOTE — Telephone Encounter (Signed)
 FYI Only or Action Required?: FYI only for provider: appointment scheduled on 05/06/24.  Patient was last seen in primary care on 01/17/2024 by Gayle Saddie FALCON, PA-C.  Called Nurse Triage reporting Skin Problem.  Symptoms began a week ago.  Interventions attempted: Nothing.  Symptoms are: unchanged.  Triage Disposition: See PCP When Office is Open (Within 3 Days)  Patient/caregiver understands and will follow disposition?: Yes            Copied from CRM #[id number]. Topic: Clinical - Red Word Triage >> May 05, 2024  8:20 AM Darshell M wrote: Red Word that prompted transfer to Nurse Triage: Daughter Leanne calling to report patient has what looks like a bed sore on leg. He has had this for more than a week and she is just finding out. Not sure which leg. Reason for Disposition  [1] Minor skin injury (e,g,. minor cut, scratch, scrape) on foot AND [2] diabetes (diabetes mellitus)  Answer Assessment - Initial Assessment Questions 1. APPEARANCE What does the injury look like?      Multiple scab areas 2. ONSET: How long ago did the injury occur?      X 1 week 3. LOCATION: Where is the injury located?      Inside of chin 4. SIZE: How large is the cut?      Endorses scabbing 5. BLEEDING: Is it bleeding now? If Yes, ask: Is it difficult to stop?      denies 6. PAIN: Is there any pain? If Yes, ask: How bad is the pain? (Scale 0-10; or none, mild, moderate, severe)     *No Answer* 7. MECHANISM: Tell me how it happened.      Thinks r/t not changing socks - skin sore 8. TETANUS: When was your last tetanus booster?     *No Answer* 9. PREGNANCY: Is there any chance you are pregnant? When was your last menstrual period?     N/a  Protocols used: Skin Injury-A-AH

## 2024-05-05 NOTE — Progress Notes (Unsigned)
 Acute Office Visit  Subjective:    Patient ID: John Franco, male    DOB: Jan 16, 1951, 73 y.o.   MRN: [id number]  Chief Complaint  Patient presents with  . Cystitis   Discussed the use of AI scribe software for clinical note transcription with the patient, who gave verbal consent to proceed.  History of Present Illness   John Franco is a 73 year old male who presents with a rash and swelling on his legs.  Lower extremity rash - Rash primarily on the anterior legs, onset approximately 1-2 weeks ago - Progression to blisters and dark spots - History of a previous similar persistent rash - No topical treatments applied due to concern for irritation - Suspects possible relation to prior fungal infection approximately one year ago, though no culture was performed  Lower extremity edema and blistering - Swelling of the legs, attributed to sleeping in a chair and not lying down - Presence of water blisters that burst and form sores - Elevates feet more at night to alleviate swelling  Hypertension - Missed one dose of blood pressure medication (valsartan  hydrochlorothiazide ) - Occasionally takes an extra diuretic for swelling - No recent fever or headaches      Past Medical History:  Diagnosis Date  . GERD (gastroesophageal reflux disease)   . Hyperlipidemia   . Hypertension   . Left inguinal hernia 12/2022  . Non-recurrent bilateral inguinal hernia without obstruction or gangrene 01/09/2023  . Pre-diabetes   . Umbilical hernia 12/2022  . Umbilical hernia without obstruction and without gangrene 01/09/2023    Past Surgical History:  Procedure Laterality Date  . FOOT SURGERY Left 1987   heel fracture  . HARDWARE REMOVAL Left    heel  . UMBILICAL HERNIA REPAIR N/A 01/09/2023   Procedure: HERNIA REPAIR UMBILICAL ADULT;  Surgeon: Desiderio Schanz, MD;  Location: [facility] OR[facility];  Service: General;  Laterality: N/A;  . XI ROBOTIC ASSISTED INGUINAL HERNIA REPAIR WITH MESH Bilateral  01/09/2023   Procedure: XI ROBOTIC ASSISTED INGUINAL HERNIA REPAIR WITH MESH;  Surgeon: Desiderio Schanz, MD;  Location: [facility] OR[facility];  Service: General;  Laterality: Bilateral;    Family History  Problem Relation Age of Onset  . Arthritis Mother   . Cancer Mother        breast  . Heart attack Father   . Cancer Brother        colon  . Cancer Maternal Grandmother        breast  . Pulmonary embolism Maternal Grandfather   . Cancer Paternal Grandmother        ovarian  . Cancer Paternal Grandfather        prostate    Social History   Socioeconomic History  . Marital status: Married    Spouse name: Dorthea  . Number of children: 2  . Years of education: Not on file  . Highest education level: Not on file  Occupational History  . Not on file  Tobacco Use  . Smoking status: Former    Types: Pipe    Quit date: 06/19/1978    Years since quitting: 45.9    Passive exposure: Past  . Smokeless tobacco: Never  Vaping Use  . Vaping status: Never Used  Substance and Sexual Activity  . Alcohol use: Not Currently    Comment: very seldom  . Drug use: No  . Sexual activity: Not Currently  Other Topics Concern  . Not on file  Social History Narrative  . Not on file  Social Drivers of Corporate Investment Banker Strain: Low Risk  (07/26/2023)   Overall Financial Resource Strain (CARDIA)   . Difficulty of Paying Living Expenses: Not hard at all  Food Insecurity: No Food Insecurity (07/26/2023)   Hunger Vital Sign   . Worried About Programme Researcher, Broadcasting/film/video in the Last Year: Never true   . Ran Out of Food in the Last Year: Never true  Transportation Needs: No Transportation Needs (07/26/2023)   PRAPARE - Transportation   . Lack of Transportation (Medical): No   . Lack of Transportation (Non-Medical): No  Physical Activity: Inactive (07/26/2023)   Exercise Vital Sign   . Days of Exercise per Week: 0 days   . Minutes of Exercise per Session: 0 min  Stress: No Stress Concern Present (07/26/2023)    [employer] of Occupational Health - Occupational Stress Questionnaire   . Feeling of Stress : Only a little  Social Connections: Moderately Isolated (07/26/2023)   Social Connection and Isolation Panel   . Frequency of Communication with Friends and Family: More than three times a week   . Frequency of Social Gatherings with Friends and Family: Once a week   . Attends Religious Services: Never   . Active Member of Clubs or Organizations: No   . Attends Banker Meetings: Never   . Marital Status: Married  Catering Manager Violence: Not At Risk (07/26/2023)   Humiliation, Afraid, Rape, and Kick questionnaire   . Fear of Current or Ex-Partner: No   . Emotionally Abused: No   . Physically Abused: No   . Sexually Abused: No    Outpatient Medications Prior to Visit  Medication Sig Dispense Refill  . cyclobenzaprine  (FLEXERIL ) 5 MG tablet Take 1 tablet (5 mg total) by mouth 3 (three) times daily as needed for muscle spasms. 30 tablet 1  . hydrochlorothiazide  (MICROZIDE ) 12.5 MG capsule Take 1 capsule (12.5 mg total) by mouth daily. (Patient taking differently: Take 12.5 mg by mouth daily as needed.) 90 capsule 2  . ibuprofen  (ADVIL ) 600 MG tablet Take 1 tablet (600 mg total) by mouth every 8 (eight) hours as needed for moderate pain. 60 tablet 1  . triamcinolone  cream (KENALOG ) 0.1 % Apply 1 Application topically 2 (two) times daily. (Patient taking differently: Apply 1 Application topically 2 (two) times daily as needed. leg) 30 g 1  . valsartan -hydrochlorothiazide  (DIOVAN -HCT) 80-12.5 MG tablet TAKE 1 TABLET BY MOUTH EVERY DAY 90 tablet 1   No facility-administered medications prior to visit.    Allergies  Allergen Reactions  . Codeine Other (See Comments)    jittery    Review of Systems  Constitutional:  Negative for appetite change, fatigue and fever.  HENT:  Negative for congestion, ear pain, sinus pressure and sore throat.   Eyes: Negative.   Respiratory:   Negative for cough, chest tightness, shortness of breath and wheezing.   Cardiovascular:  Positive for leg swelling. Negative for chest pain and palpitations.  Gastrointestinal:  Negative for abdominal pain, constipation, diarrhea, nausea and vomiting.  Endocrine: Negative.   Genitourinary:  Negative for dysuria and hematuria.  Musculoskeletal:  Negative for arthralgias, back pain, joint swelling and myalgias.  Skin:  Negative for rash.  Allergic/Immunologic: Negative.   Neurological:  Negative for dizziness, weakness, light-headedness and headaches.  Psychiatric/Behavioral:  Negative for dysphoric mood. The patient is not nervous/anxious.        Objective:        01/17/2024    1:53 PM 01/17/2024  1:16 PM 07/26/2023    9:31 AM  Vitals with BMI  Height  5' 5 --  Weight  231 lbs 6 oz --  BMI  38.51   Systolic 148 144 --  Diastolic 83 77 --  Pulse  68     No data found.   Physical Exam  Health Maintenance Due  Topic Date Due  . Fecal DNA (Cologuard)  Never done  . Pneumococcal Vaccine: 50+ Years (1 of 1 - PCV) Never done  . Zoster Vaccines- Shingrix  (1 of 2) Never done  . Influenza Vaccine  01/18/2024  . COVID-19 Vaccine (6 - 2025-26 season) 02/18/2024    There are no preventive care reminders to display for this patient.   Lab Results  Component Value Date   TSH 1.590 01/24/2024   Lab Results  Component Value Date   WBC 6.9 01/24/2024   HGB 15.0 01/24/2024   HCT 45.6 01/24/2024   MCV 92 01/24/2024   PLT 267 01/24/2024   Lab Results  Component Value Date   NA 138 01/24/2024   K 4.2 01/24/2024   CO2 22 01/24/2024   GLUCOSE 90 01/24/2024   BUN 17 01/24/2024   CREATININE 1.19 01/24/2024   BILITOT 0.3 01/24/2024   ALKPHOS 75 01/24/2024   AST 16 01/24/2024   ALT 13 01/24/2024   PROT 7.0 01/24/2024   ALBUMIN 4.1 01/24/2024   CALCIUM 9.5 01/24/2024   ANIONGAP 6 01/03/2023   EGFR 64 01/24/2024   Lab Results  Component Value Date   CHOL 213 (H)  01/24/2024   Lab Results  Component Value Date   HDL 39 (L) 01/24/2024   Lab Results  Component Value Date   LDLCALC 147 (H) 01/24/2024   Lab Results  Component Value Date   TRIG 148 01/24/2024   Lab Results  Component Value Date   CHOLHDL 5.5 (H) 01/24/2024   Lab Results  Component Value Date   HGBA1C 6.0 (H) 01/24/2024        Results for orders placed or performed in visit on 01/24/24  CBC with Differential/Platelet   Collection Time: 01/24/24 10:19 AM  Result Value Ref Range   WBC 6.9 3.4 - 10.8 x10E3/uL   RBC 4.94 4.14 - 5.80 x10E6/uL   Hemoglobin 15.0 13.0 - 17.7 g/dL   Hematocrit 54.3 62.4 - 51.0 %   MCV 92 79 - 97 fL   MCH 30.4 26.6 - 33.0 pg   MCHC 32.9 31.5 - 35.7 g/dL   RDW 86.8 88.3 - 84.5 %   Platelets 267 150 - 450 x10E3/uL   Neutrophils 67 Not Estab. %   Lymphs 22 Not Estab. %   Monocytes 9 Not Estab. %   Eos 2 Not Estab. %   Basos 0 Not Estab. %   Neutrophils Absolute 4.6 1.4 - 7.0 x10E3/uL   Lymphocytes Absolute 1.5 0.7 - 3.1 x10E3/uL   Monocytes Absolute 0.6 0.1 - 0.9 x10E3/uL   EOS (ABSOLUTE) 0.2 0.0 - 0.4 x10E3/uL   Basophils Absolute 0.0 0.0 - 0.2 x10E3/uL   Immature Granulocytes 0 Not Estab. %   Immature Grans (Abs) 0.0 0.0 - 0.1 x10E3/uL  Comprehensive metabolic panel with GFR   Collection Time: 01/24/24 10:19 AM  Result Value Ref Range   Glucose 90 70 - 99 mg/dL   BUN 17 8 - 27 mg/dL   Creatinine, Ser 8.80 0.76 - 1.27 mg/dL   eGFR 64 >40 fO/fpw/8.26   BUN/Creatinine Ratio 14 10 - 24   Sodium  138 134 - 144 mmol/L   Potassium 4.2 3.5 - 5.2 mmol/L   Chloride 99 96 - 106 mmol/L   CO2 22 20 - 29 mmol/L   Calcium 9.5 8.6 - 10.2 mg/dL   Total Protein 7.0 6.0 - 8.5 g/dL   Albumin 4.1 3.8 - 4.8 g/dL   Globulin, Total 2.9 1.5 - 4.5 g/dL   Bilirubin Total 0.3 0.0 - 1.2 mg/dL   Alkaline Phosphatase 75 44 - 121 IU/L   AST 16 0 - 40 IU/L   ALT 13 0 - 44 IU/L  Lipid panel   Collection Time: 01/24/24 10:19 AM  Result Value Ref Range    Cholesterol, Total 213 (H) 100 - 199 mg/dL   Triglycerides 851 0 - 149 mg/dL   HDL 39 (L) >60 mg/dL   VLDL Cholesterol Cal 27 5 - 40 mg/dL   LDL Chol Calc (NIH) 852 (H) 0 - 99 mg/dL   Chol/HDL Ratio 5.5 (H) 0.0 - 5.0 ratio  Hemoglobin A1c   Collection Time: 01/24/24 10:19 AM  Result Value Ref Range   Hgb A1c MFr Bld 6.0 (H) 4.8 - 5.6 %   Est. average glucose Bld gHb Est-mCnc 126 mg/dL  TSH   Collection Time: 01/24/24 10:19 AM  Result Value Ref Range   TSH 1.590 0.450 - 4.500 uIU/mL  VITAMIN D  25 Hydroxy (Vit-D Deficiency, Fractures)   Collection Time: 01/24/24 10:19 AM  Result Value Ref Range   Vit D, 25-Hydroxy 41.0 30.0 - 100.0 ng/mL     Assessment & Plan:   Assessment & Plan Dermatitis, stasis  Orders: .  mupirocin ointment (BACTROBAN) 2 %; Apply 1 Application topically 2 (two) times daily.  Pedal edema  Orders: .  furosemide (LASIX) 20 MG tablet; Take 1 tablet (20 mg total) by mouth daily.     There is no height or weight on file to calculate BMI.SABRA  Assessment and Plan      No orders of the defined types were placed in this encounter.   No orders of the defined types were placed in this encounter.    Follow-up: No follow-ups on file.  An After Visit Summary was printed and given to the patient.  Harrie CHRISTELLA Cedar, FNP [facility] [phone]

## 2024-05-06 DIAGNOSIS — R600 Localized edema: Secondary | ICD-10-CM | POA: Insufficient documentation

## 2024-05-06 DIAGNOSIS — I872 Venous insufficiency (chronic) (peripheral): Secondary | ICD-10-CM | POA: Insufficient documentation

## 2024-05-06 NOTE — Assessment & Plan Note (Addendum)
 Dermatitis with scabbing and blistering, lower extremity Dermatitis with scabbing and blistering, likely exacerbated by swelling. Healing indicated by scabs. Differential includes cellulitis, but no systemic signs present. - Apply Bactroban  twice daily to affected areas. - Monitor for fever, streaking, or pus; consider culture if symptoms develop. Orders:   mupirocin  ointment (BACTROBAN ) 2 %; Apply 1 Application topically 2 (two) times daily.

## 2024-05-06 NOTE — Assessment & Plan Note (Signed)
 Morbid Obesity BMI - 36.74 Comorbidities: Hypertension and Hyperlipidemia - Recommend continue working on diet and exercise

## 2024-05-06 NOTE — Assessment & Plan Note (Signed)
 Hypertension with lower extremity edema and chronic venous insufficiency Hypertension with recent readings of 176/80, likely due to missed doses of valsartan  hydrochlorothiazide . Discussed potential need for cardiologist referral due to family history of heart disease and additional risk factors (obesity, hyperlipidemia, etc.) BP Readings from Last 3 Encounters:  05/05/24 (!) 176/80  01/17/24 (!) 148/83  06/01/23 138/80  - Monitor blood pressure at home twice daily. - Sent message to PCP regarding BP and plan  - Follow up with primary care provider in one week for blood pressure management and potential medication adjustment. - Consider referral to cardiologist for further evaluation of cardiovascular risk factors.

## 2024-05-06 NOTE — Assessment & Plan Note (Addendum)
 Bilateral lower extremity edema (2+ pitting) with elevated BP and stasis dermatitis.  - Prescribed Lasix 20 mg daily for edema management. - Track weight to assess fluid loss with Lasix. Orders:   furosemide (LASIX) 20 MG tablet; Take 1 tablet (20 mg total) by mouth daily.

## 2024-05-20 ENCOUNTER — Ambulatory Visit

## 2024-05-20 VITALS — BP 126/62 | HR 80 | Ht 65.0 in | Wt 215.5 lb

## 2024-05-20 DIAGNOSIS — R7303 Prediabetes: Secondary | ICD-10-CM

## 2024-05-20 DIAGNOSIS — E7800 Pure hypercholesterolemia, unspecified: Secondary | ICD-10-CM

## 2024-05-20 DIAGNOSIS — Z23 Encounter for immunization: Secondary | ICD-10-CM

## 2024-05-20 DIAGNOSIS — M546 Pain in thoracic spine: Secondary | ICD-10-CM | POA: Diagnosis not present

## 2024-05-20 DIAGNOSIS — R600 Localized edema: Secondary | ICD-10-CM | POA: Diagnosis not present

## 2024-05-20 DIAGNOSIS — M62830 Muscle spasm of back: Secondary | ICD-10-CM | POA: Diagnosis not present

## 2024-05-20 DIAGNOSIS — G8929 Other chronic pain: Secondary | ICD-10-CM | POA: Diagnosis not present

## 2024-05-20 DIAGNOSIS — I10 Essential (primary) hypertension: Secondary | ICD-10-CM

## 2024-05-20 MED ORDER — MELOXICAM 15 MG PO TABS: 15.0000 mg | ORAL_TABLET | Freq: Every day | ORAL | 1 refills | Status: AC

## 2024-05-20 MED ORDER — VALSARTAN-HYDROCHLOROTHIAZIDE 160-25 MG PO TABS: 1.0000 | ORAL_TABLET | Freq: Every day | ORAL | 3 refills | Status: AC

## 2024-05-20 MED ORDER — FUROSEMIDE 20 MG PO TABS: 20.0000 mg | ORAL_TABLET | Freq: Every day | ORAL | 0 refills | Status: DC

## 2024-05-20 MED ORDER — TIZANIDINE HCL 4 MG PO TABS: 4.0000 mg | ORAL_TABLET | Freq: Four times a day (QID) | ORAL | 0 refills | Status: AC | PRN

## 2024-05-20 MED ORDER — ROSUVASTATIN CALCIUM 5 MG PO TABS: 5.0000 mg | ORAL_TABLET | Freq: Every day | ORAL | 1 refills | Status: AC

## 2024-05-20 NOTE — Patient Instructions (Addendum)
 [email or web address]  VISIT SUMMARY: During your visit, we discussed your blood pressure management, back pain, leg swelling, and cholesterol levels. We made some adjustments to your medications and recommended further tests and treatments to help manage your conditions.  YOUR PLAN: HYPERTENSION: Your blood pressure has been elevated recently due to missed medication doses but has improved after resuming your medication. -Continue taking valsartan -hydrochlorothiazide  but at the higher dose (160-25 mg). For the tablets you have left at home, just take two of them.  -Discontinue the separate hydrochlorothiazide  12.5 mg capsule  -Monitor your blood pressure at home and report if your systolic reading is below 100 or if you experience symptoms of low blood pressure.  CHRONIC MIDLINE THORACIC BACK PAIN: You have been experiencing sporadic and severe back pain with occasional numbness and tingling in your arm. -Get a thoracic spine x-ray at [facility]. -Take meloxicam 15 mg once daily as needed for pain. -Take tizanidine as prescribed for muscle relaxation. -Consider seeing a chiropractor for additional care.  PEDAL EDEMA: Your leg swelling has improved with medication and leg elevation. -Continue taking Lasix  20 mg daily. -Continue to elevate your legs to help reduce swelling.  HYPERLIPIDEMIA: Your cholesterol levels have been elevated recently. -Start taking rosuvastatin 5 mg once daily at bedtime. -Begin with half a tablet for a few days to monitor for any side effects. -Report any muscle aches or other side effects you may experience.  If you have any problems before your next visit feel free to message me via MyChart (minor issues or questions) or call the office, otherwise you may reach out to schedule an office visit.  Thank you! Saddie Sacks, PA-C

## 2024-05-21 ENCOUNTER — Telehealth: Payer: Self-pay

## 2024-05-21 NOTE — Assessment & Plan Note (Addendum)
 BP goal <130/80.  BP above goal in office today, at goal on recheck at 126/62.  Patient did bring his blood pressure log which revealed average readings in the high 130s to 140s SBP.  Will discontinue separate tablet of hydrochlorothiazide  12.5 mg and prescribe valsartan -hydrochlorothiazide  160-25 mg.  Continue Lasix  20 mg for swelling.  Monitor blood pressure at home; report systolic <100 or hypotension symptoms.

## 2024-05-21 NOTE — Assessment & Plan Note (Signed)
 Last lipid panel: LDL 147, HDL 39, triglycerides 148. The 10-year ASCVD risk score (Arnett DK, et al., 2019) is: 27.4% Patient agreeable to starting rosuvastatin to lower his cardiovascular risk.  Start rosuvastatin 5 mg daily.  Will follow-up in 8 weeks to reassess tolerance and recheck lipid/CMP.

## 2024-05-21 NOTE — Assessment & Plan Note (Signed)
 Will replace ibuprofen  with meloxicam 15 mg for more consistent pain control.  Flexeril  was ineffective for the acute muscle spasms.  Will do a trial of tizanidine 4 mg instead.  Have also placed order for x-ray of T-spine to evaluate causes for pain.

## 2024-05-21 NOTE — Assessment & Plan Note (Signed)
 Morbid Obesity BMI - 36.74 Comorbidities: Hypertension and Hyperlipidemia - Recommend continue working on diet and exercise  Will continue to manage chronic issues

## 2024-05-21 NOTE — Progress Notes (Signed)
 Established Patient Office Visit  Subjective   Patient ID: John Franco, male    DOB: 11/11/1950  Age: 73 y.o. MRN: [id number]  Chief Complaint  Patient presents with   Hypertension    HPI  History of Present Illness   John Franco is a 73 year old male with hypertension who presents for follow-up of blood pressure management and back pain. He is accompanied by his daughter, John Franco.  Hypertension - Elevated blood pressure during recent vascular specialist visit due to missed medication dose - Blood pressure improved after resuming medication, with average readings in the high 130s to 140s - Currently taking valsartan , hydrochlorothiazide , and furosemide  (Lasix ) 20 mg daily  Thoracic and upper extremity pain and paresthesia - Middle back pain, sporadic and sometimes severe, radiating from the shoulder area around T5 - No recent imaging for back pain - Alternates between ibuprofen  and aspirin for pain relief; ibuprofen  provides benefit but cautious about overuse - Cyclobenzaprine  trialed for muscle spasms without benefit  Hyperlipidemia - Elevated cholesterol levels identified during last vascular specialist visit - No prior use of cholesterol-lowering medication          ROS Per HPI.    Objective:     BP 126/62   Pulse 80   Ht 5' 5 (1.651 m)   Wt 215 lb 8 oz (97.8 kg)   SpO2 95%   BMI 35.86 kg/m    Physical Exam Constitutional:      General: He is not in acute distress.    Appearance: Normal appearance.  Cardiovascular:     Rate and Rhythm: Normal rate and regular rhythm.     Heart sounds: Normal heart sounds. No murmur heard.    No friction rub. No gallop.  Pulmonary:     Effort: Pulmonary effort is normal. No respiratory distress.     Breath sounds: Normal breath sounds.  Musculoskeletal:     Thoracic back: Spasms present.     Right lower leg: Edema present.     Left lower leg: Edema present.  Skin:    General: Skin is warm and dry.  Neurological:      General: No focal deficit present.     Mental Status: He is alert.  Psychiatric:        Mood and Affect: Mood normal.        Behavior: Behavior normal.        Thought Content: Thought content normal.      No results found for any visits on 05/20/24.  Last CBC Lab Results  Component Value Date   WBC 6.9 01/24/2024   HGB 15.0 01/24/2024   HCT 45.6 01/24/2024   MCV 92 01/24/2024   MCH 30.4 01/24/2024   RDW 13.1 01/24/2024   PLT 267 01/24/2024   Last metabolic panel Lab Results  Component Value Date   GLUCOSE 90 01/24/2024   NA 138 01/24/2024   K 4.2 01/24/2024   CL 99 01/24/2024   CO2 22 01/24/2024   BUN 17 01/24/2024   CREATININE 1.19 01/24/2024   EGFR 64 01/24/2024   CALCIUM  9.5 01/24/2024   PROT 7.0 01/24/2024   ALBUMIN 4.1 01/24/2024   LABGLOB 2.9 01/24/2024   AGRATIO 1.6 11/23/2022   BILITOT 0.3 01/24/2024   ALKPHOS 75 01/24/2024   AST 16 01/24/2024   ALT 13 01/24/2024   ANIONGAP 6 01/03/2023   Last lipids Lab Results  Component Value Date   CHOL 213 (H) 01/24/2024   HDL 39 (L) 01/24/2024  LDLCALC 147 (H) 01/24/2024   TRIG 148 01/24/2024   CHOLHDL 5.5 (H) 01/24/2024   Last hemoglobin A1c Lab Results  Component Value Date   HGBA1C 6.0 (H) 01/24/2024   Last thyroid  functions Lab Results  Component Value Date   TSH 1.590 01/24/2024   Last vitamin D  Lab Results  Component Value Date   VD25OH 41.0 01/24/2024      The 10-year ASCVD risk score (Arnett DK, et al., 2019) is: 27.4%    Assessment & Plan:   Chronic midline thoracic back pain -     DG Thoracic Spine 2 View; Future  Pedal edema Assessment & Plan: Per vascular specialist Harrie Cedar NP:  Bilateral lower extremity edema (2+ pitting) with elevated BP and stasis dermatitis.  - Prescribed Lasix  20 mg daily for edema management.  Edema 1+ on exam today. Continue lasix  20 mg  Orders: -     Furosemide ; Take 1 tablet (20 mg total) by mouth daily.  Dispense: 30 tablet; Refill:  0  Encounter for immunization -     Flu vaccine HIGH DOSE PF(Fluzone Trivalent)  Morbid obesity (HCC) Assessment & Plan: Morbid Obesity BMI - 36.74 Comorbidities: Hypertension and Hyperlipidemia - Recommend continue working on diet and exercise  Will continue to manage chronic issues    Pure hypercholesterolemia Assessment & Plan: Last lipid panel: LDL 147, HDL 39, triglycerides 148. The 10-year ASCVD risk score (Arnett DK, et al., 2019) is: 27.4% Patient agreeable to starting rosuvastatin to lower his cardiovascular risk.  Start rosuvastatin 5 mg daily.  Will follow-up in 8 weeks to reassess tolerance and recheck lipid/CMP.   Pre-diabetes Assessment & Plan: A1c stable at 6.0.  Continue with low-carb/sugar diet and more routine physical activity as tolerated.  Will continue to monitor.   Back muscle spasm Assessment & Plan: Will replace ibuprofen  with meloxicam 15 mg for more consistent pain control.  Flexeril  was ineffective for the acute muscle spasms.  Will do a trial of tizanidine 4 mg instead.  Have also placed order for x-ray of T-spine to evaluate causes for pain.   Primary hypertension Assessment & Plan: BP goal <130/80.  BP above goal in office today, at goal on recheck at 126/62.  Patient did bring his blood pressure log which revealed average readings in the high 130s to 140s SBP.  Will discontinue separate tablet of hydrochlorothiazide  12.5 mg and prescribe valsartan -hydrochlorothiazide  160-25 mg.  Continue Lasix  20 mg for swelling.  Monitor blood pressure at home; report systolic <100 or hypotension symptoms.   Other orders -     Valsartan -hydroCHLOROthiazide ; Take 1 tablet by mouth daily.  Dispense: 90 tablet; Refill: 3 -     Meloxicam; Take 1 tablet (15 mg total) by mouth daily.  Dispense: 30 tablet; Refill: 1 -     tiZANidine HCl; Take 1 tablet (4 mg total) by mouth every 6 (six) hours as needed for muscle spasms.  Dispense: 20 tablet; Refill: 0 -      Rosuvastatin Calcium; Take 1 tablet (5 mg total) by mouth at bedtime.  Dispense: 90 tablet; Refill: 1    Return in about 8 weeks (around 07/15/2024) for HTN, HLD, back pain.    Saddie JULIANNA Sacks, PA-C

## 2024-05-21 NOTE — Assessment & Plan Note (Signed)
 Per vascular specialist Harrie Cedar NP:  Bilateral lower extremity edema (2+ pitting) with elevated BP and stasis dermatitis.  - Prescribed Lasix  20 mg daily for edema management.  Edema 1+ on exam today. Continue lasix  20 mg

## 2024-05-21 NOTE — Assessment & Plan Note (Signed)
 A1c stable at 6.0.  Continue with low-carb/sugar diet and more routine physical activity as tolerated.  Will continue to monitor.

## 2024-05-21 NOTE — Telephone Encounter (Signed)
 Copied from CRM #[id number]. Topic: Appointments - Scheduling Inquiry for Clinic >> May 21, 2024  8:43 AM Harlene ORN wrote: Reason for CRM: Daughter called. Lost their AVS paperwork form appointment on 12/02, would like a copy of the paperwork to be picked up by today or tomorrow  AVS for 12/02 can be seen in MyChart. Patient can come pick up copy if necessary.

## 2024-06-04 ENCOUNTER — Ambulatory Visit
Admission: RE | Admit: 2024-06-04 | Discharge: 2024-06-04 | Disposition: A | Discharge status: Home / Self Care | Source: Ambulatory Visit

## 2024-06-04 ENCOUNTER — Other Ambulatory Visit: Payer: Self-pay

## 2024-06-04 DIAGNOSIS — Z23 Encounter for immunization: Secondary | ICD-10-CM

## 2024-06-04 DIAGNOSIS — R7303 Prediabetes: Secondary | ICD-10-CM

## 2024-06-04 DIAGNOSIS — E7800 Pure hypercholesterolemia, unspecified: Secondary | ICD-10-CM

## 2024-06-04 DIAGNOSIS — R600 Localized edema: Secondary | ICD-10-CM

## 2024-06-04 DIAGNOSIS — I10 Essential (primary) hypertension: Secondary | ICD-10-CM

## 2024-06-04 DIAGNOSIS — G8929 Other chronic pain: Secondary | ICD-10-CM

## 2024-06-04 DIAGNOSIS — M62830 Muscle spasm of back: Secondary | ICD-10-CM

## 2024-06-08 ENCOUNTER — Ambulatory Visit: Payer: Self-pay

## 2024-06-26 NOTE — Progress Notes (Signed)
 John Franco                                          MRN: [id number]   06/26/2024   The [facility] Quality Team Specialist reviewed this patient medical record for the purposes of chart review for care gap closure. The following were reviewed: abstraction for care gap closure-controlling blood pressure.    [facility] Quality Team

## 2024-06-29 ENCOUNTER — Other Ambulatory Visit: Payer: Self-pay

## 2024-06-29 DIAGNOSIS — R600 Localized edema: Secondary | ICD-10-CM

## 2024-07-07 ENCOUNTER — Other Ambulatory Visit: Payer: Self-pay

## 2024-07-07 DIAGNOSIS — R7303 Prediabetes: Secondary | ICD-10-CM

## 2024-07-07 DIAGNOSIS — E7800 Pure hypercholesterolemia, unspecified: Secondary | ICD-10-CM

## 2024-07-07 DIAGNOSIS — I10 Essential (primary) hypertension: Secondary | ICD-10-CM

## 2024-07-10 ENCOUNTER — Other Ambulatory Visit

## 2024-07-10 DIAGNOSIS — R7303 Prediabetes: Secondary | ICD-10-CM

## 2024-07-10 DIAGNOSIS — E7800 Pure hypercholesterolemia, unspecified: Secondary | ICD-10-CM

## 2024-07-10 DIAGNOSIS — I10 Essential (primary) hypertension: Secondary | ICD-10-CM

## 2024-07-11 LAB — CBC WITH DIFFERENTIAL/PLATELET
Basophils Absolute: 0 x10E3/uL (ref 0.0–0.2)
Basos: 0 %
EOS (ABSOLUTE): 0.2 x10E3/uL (ref 0.0–0.4)
Eos: 3 %
Hematocrit: 41.5 % (ref 37.5–51.0)
Hemoglobin: 13.8 g/dL (ref 13.0–17.7)
Immature Grans (Abs): 0 x10E3/uL (ref 0.0–0.1)
Immature Granulocytes: 0 %
Lymphocytes Absolute: 1.7 x10E3/uL (ref 0.7–3.1)
Lymphs: 25 %
MCH: 29.9 pg (ref 26.6–33.0)
MCHC: 33.3 g/dL (ref 31.5–35.7)
MCV: 90 fL (ref 79–97)
Monocytes Absolute: 0.7 x10E3/uL (ref 0.1–0.9)
Monocytes: 10 %
Neutrophils Absolute: 4.2 x10E3/uL (ref 1.4–7.0)
Neutrophils: 62 %
Platelets: 235 x10E3/uL (ref 150–450)
RBC: 4.62 x10E6/uL (ref 4.14–5.80)
RDW: 12.9 % (ref 11.6–15.4)
WBC: 6.7 x10E3/uL (ref 3.4–10.8)

## 2024-07-11 LAB — COMPREHENSIVE METABOLIC PANEL WITH GFR
ALT: 15 IU/L (ref 0–44)
AST: 19 IU/L (ref 0–40)
Albumin: 4 g/dL (ref 3.8–4.8)
Alkaline Phosphatase: 67 IU/L (ref 47–123)
BUN/Creatinine Ratio: 9 — ABNORMAL LOW (ref 10–24)
BUN: 8 mg/dL (ref 8–27)
Bilirubin Total: 0.3 mg/dL (ref 0.0–1.2)
CO2: 24 mmol/L (ref 20–29)
Calcium: 9.8 mg/dL (ref 8.6–10.2)
Chloride: 101 mmol/L (ref 96–106)
Creatinine, Ser: 0.92 mg/dL (ref 0.76–1.27)
Globulin, Total: 2 g/dL (ref 1.5–4.5)
Glucose: 95 mg/dL (ref 70–99)
Potassium: 3.9 mmol/L (ref 3.5–5.2)
Sodium: 141 mmol/L (ref 134–144)
Total Protein: 6 g/dL (ref 6.0–8.5)
eGFR: 88 mL/min/1.73

## 2024-07-11 LAB — LIPID PANEL
Chol/HDL Ratio: 3.4 ratio (ref 0.0–5.0)
Cholesterol, Total: 141 mg/dL (ref 100–199)
HDL: 41 mg/dL
LDL Chol Calc (NIH): 76 mg/dL (ref 0–99)
Triglycerides: 138 mg/dL (ref 0–149)
VLDL Cholesterol Cal: 24 mg/dL (ref 5–40)

## 2024-07-11 LAB — HEMOGLOBIN A1C
Est. average glucose Bld gHb Est-mCnc: 128 mg/dL
Hgb A1c MFr Bld: 6.1 % — ABNORMAL HIGH (ref 4.8–5.6)

## 2024-07-11 LAB — TSH: TSH: 1.36 u[IU]/mL (ref 0.450–4.500)

## 2024-07-13 ENCOUNTER — Ambulatory Visit: Payer: Self-pay

## 2024-07-15 ENCOUNTER — Ambulatory Visit

## 2024-07-16 ENCOUNTER — Ambulatory Visit

## 2024-07-24 ENCOUNTER — Ambulatory Visit

## 2024-08-21 ENCOUNTER — Ambulatory Visit: Payer: PPO
